# Patient Record
Sex: Female | Born: 1965
Health system: Southern US, Community
[De-identification: ages and names within clinical notes are randomized; demographics above are authoritative.]

## PROBLEM LIST (undated history)

## (undated) DIAGNOSIS — I10 Essential (primary) hypertension: Secondary | ICD-10-CM

## (undated) DIAGNOSIS — F419 Anxiety disorder, unspecified: Secondary | ICD-10-CM

## (undated) HISTORY — PX: WISDOM TOOTH EXTRACTION: SHX21

---

## 1998-02-08 ENCOUNTER — Other Ambulatory Visit: Admission: RE | Admit: 1998-02-08 | Discharge: 1998-02-08 | Payer: Self-pay | Admitting: *Deleted

## 1999-06-12 ENCOUNTER — Other Ambulatory Visit: Admission: RE | Admit: 1999-06-12 | Discharge: 1999-06-12 | Payer: Self-pay | Admitting: *Deleted

## 2000-10-15 ENCOUNTER — Other Ambulatory Visit: Admission: RE | Admit: 2000-10-15 | Discharge: 2000-10-15 | Payer: Self-pay | Admitting: Obstetrics and Gynecology

## 2001-09-19 ENCOUNTER — Inpatient Hospital Stay (HOSPITAL_COMMUNITY): Admission: AD | Admit: 2001-09-19 | Discharge: 2001-09-19 | Payer: Self-pay | Admitting: Obstetrics and Gynecology

## 2001-09-19 ENCOUNTER — Other Ambulatory Visit: Admission: RE | Admit: 2001-09-19 | Discharge: 2001-09-19 | Payer: Self-pay | Admitting: *Deleted

## 2001-09-19 ENCOUNTER — Other Ambulatory Visit: Admission: RE | Admit: 2001-09-19 | Discharge: 2001-09-19 | Payer: Self-pay | Admitting: Obstetrics and Gynecology

## 2002-04-16 ENCOUNTER — Inpatient Hospital Stay (HOSPITAL_COMMUNITY): Admission: AD | Admit: 2002-04-16 | Discharge: 2002-04-16 | Payer: Self-pay | Admitting: Obstetrics and Gynecology

## 2002-04-16 ENCOUNTER — Encounter: Payer: Self-pay | Admitting: Obstetrics and Gynecology

## 2002-04-19 ENCOUNTER — Inpatient Hospital Stay (HOSPITAL_COMMUNITY): Admission: AD | Admit: 2002-04-19 | Discharge: 2002-04-22 | Payer: Self-pay | Admitting: Obstetrics and Gynecology

## 2002-06-09 ENCOUNTER — Other Ambulatory Visit: Admission: RE | Admit: 2002-06-09 | Discharge: 2002-06-09 | Payer: Self-pay | Admitting: Obstetrics and Gynecology

## 2003-08-02 ENCOUNTER — Other Ambulatory Visit: Admission: RE | Admit: 2003-08-02 | Discharge: 2003-08-02 | Payer: Self-pay | Admitting: Obstetrics & Gynecology

## 2005-10-13 ENCOUNTER — Emergency Department (HOSPITAL_COMMUNITY): Admission: EM | Admit: 2005-10-13 | Discharge: 2005-10-13 | Payer: Self-pay | Admitting: Emergency Medicine

## 2005-10-17 ENCOUNTER — Emergency Department (HOSPITAL_COMMUNITY): Admission: EM | Admit: 2005-10-17 | Discharge: 2005-10-17 | Payer: Self-pay | Admitting: Emergency Medicine

## 2005-10-25 ENCOUNTER — Ambulatory Visit: Payer: Self-pay | Admitting: Cardiology

## 2005-10-30 ENCOUNTER — Ambulatory Visit: Payer: Self-pay | Admitting: Cardiology

## 2005-11-06 ENCOUNTER — Other Ambulatory Visit: Admission: RE | Admit: 2005-11-06 | Discharge: 2005-11-06 | Payer: Self-pay | Admitting: *Deleted

## 2005-12-18 ENCOUNTER — Encounter: Admission: RE | Admit: 2005-12-18 | Discharge: 2005-12-18 | Payer: Self-pay | Admitting: *Deleted

## 2007-02-03 ENCOUNTER — Other Ambulatory Visit: Admission: RE | Admit: 2007-02-03 | Discharge: 2007-02-03 | Payer: Self-pay | Admitting: *Deleted

## 2007-02-18 ENCOUNTER — Encounter: Admission: RE | Admit: 2007-02-18 | Discharge: 2007-02-18 | Payer: Self-pay | Admitting: *Deleted

## 2008-04-14 ENCOUNTER — Encounter: Admission: RE | Admit: 2008-04-14 | Discharge: 2008-04-14 | Payer: Self-pay | Admitting: Gynecology

## 2008-05-10 ENCOUNTER — Other Ambulatory Visit: Admission: RE | Admit: 2008-05-10 | Discharge: 2008-05-10 | Payer: Self-pay | Admitting: Gynecology

## 2009-06-12 ENCOUNTER — Inpatient Hospital Stay (HOSPITAL_COMMUNITY): Admission: AD | Admit: 2009-06-12 | Discharge: 2009-06-12 | Payer: Self-pay | Admitting: Obstetrics and Gynecology

## 2009-10-27 ENCOUNTER — Encounter: Admission: RE | Admit: 2009-10-27 | Discharge: 2009-10-27 | Payer: Self-pay | Admitting: Obstetrics and Gynecology

## 2010-07-30 ENCOUNTER — Encounter: Payer: Self-pay | Admitting: *Deleted

## 2010-08-07 ENCOUNTER — Other Ambulatory Visit (HOSPITAL_COMMUNITY)
Admission: RE | Admit: 2010-08-07 | Discharge: 2010-08-07 | Disposition: A | Discharge status: Home / Self Care | Payer: 59 | Source: Ambulatory Visit | Attending: Geriatric Medicine | Admitting: Geriatric Medicine

## 2010-08-07 ENCOUNTER — Other Ambulatory Visit: Payer: Self-pay

## 2010-08-07 DIAGNOSIS — R87810 Cervical high risk human papillomavirus (HPV) DNA test positive: Secondary | ICD-10-CM | POA: Insufficient documentation

## 2010-08-07 DIAGNOSIS — Z01419 Encounter for gynecological examination (general) (routine) without abnormal findings: Secondary | ICD-10-CM | POA: Insufficient documentation

## 2010-10-10 LAB — HCG, QUANTITATIVE, PREGNANCY: hCG, Beta Chain, Quant, S: 233 m[IU]/mL — ABNORMAL HIGH (ref <5)

## 2010-10-11 ENCOUNTER — Other Ambulatory Visit: Payer: Self-pay | Admitting: Obstetrics and Gynecology

## 2010-10-11 DIAGNOSIS — Z1231 Encounter for screening mammogram for malignant neoplasm of breast: Secondary | ICD-10-CM

## 2010-10-30 ENCOUNTER — Ambulatory Visit: Payer: 59

## 2010-11-06 ENCOUNTER — Ambulatory Visit: Payer: 59

## 2010-11-24 NOTE — Discharge Summary (Signed)
NAME:  Amber Mendez, Amber Mendez                       ACCOUNT NO.:  [id number]   MEDICAL RECORD NO.:  [id number]                   PATIENT TYPE:  INP   LOCATION:  9147                                 FACILITY:  [facility]   PHYSICIAN:  Maxie Better, M.D.            DATE OF BIRTH:  01-17-1966   DATE OF ADMISSION:  04/19/2002  DATE OF DISCHARGE:  04/22/2002                                 DISCHARGE SUMMARY   ADMISSION DIAGNOSES:  1. Intrauterine gestation at 71 and 6/7 weeks.  2. Mild preeclampsia.   DISCHARGE DIAGNOSES:  1. Intrauterine gestation at 76 and 6/7 weeks delivered.  2. Mild preeclampsia, resolved.  3. Postpartum anemia.   HISTORY OF PRESENT ILLNESS:  A 45 year old gravida 1, para 0 female at 55  and 6/[redacted] weeks gestation admitted for induction of labor secondary to mild  preeclampsia.  The patient has hand, leg edema.  No headache, visual  changes, or epigastric pain.  Had her 24 urine total protein 4 mg/24-hour  urine.  Uric acid was 5.2.  Her LFTs were normal.  The patient had been on  bed rest due to swelling and blood pressure from September 23.  She had been  getting nonstress tests twice per week.  Estimated fetal weight by  ultrasound was 5 pounds 10 ounces.  Her group B Strep culture was positive.   HOSPITAL COURSE:  On admission the patient's blood pressure was 144/101.  Her examination was notable for 1+ edema.  Deep tendon reflexes at 2-3+.  No  clonus.  Her cervix was closed, soft, -2/-1, vertex presentation.  Tracing  showed baseline fetal heart rate of 140 which reactive, irregular in flash,  and frequent contractions.  Repeat [facility] laboratories were obtained on  admission.  Magnesium sulfate was planned in active labor.  Penicillin  prophylaxis for group B Strep culture.  The patient was placed on Cervidil  for ripening of her cervix with plans for Pitocin augmentation thereafter.  The patient did not require any antihypertensive medications.  After the  Cervidil  her cervix was fingertip, 70%, -2.  Her laboratories had shown  platelet count of 157,000, hematocrit 33.6, normal LFTs, uric acid of 5.2.  Pitocin was started.  The patient progressed in labor and subsequently  became completely dilated, pushed well resulting in a vaginal delivery of a  live female from the right occiput anterior presentation over a first degree  perineal laceration.  Apgars of 9 and 9.  Weight of the baby was 5 pounds 14  ounces, 20 inches long, Apgars as stated before.  She had no cervical  lacerations.  She had a right periurethral laceration that was not bleeding.  The perineal laceration was repaired with 3-0 chromic suture.  The magnesium  sulfate was continued for 24 hours after which the patient began diuresing  very well.  Her blood pressure was about 127/74 postpartum and by postpartum  day number three the patient  was doing well.  Her uterus was two  fingerbreadths below the umbilicus, nontender.  She was deemed well to be  discharged home.  A CBC on postpartum day number one showed a hemoglobin of  10.3, hematocrit of 29.3, white count 13.6, platelet count 141,000.   DISPOSITION:  Home.   DISCHARGE INSTRUCTIONS:  Call for temperature greater or equal to 100.4.  Nothing per vagina for four to six weeks.  Call of soaking a regular pad  every hour or more frequently.  Preeclampsia warning signs were again  reviewed.  Follow-up in six weeks at the [facility].    DISCHARGE MEDICATIONS:  1. Prenatal vitamins one p.o. q.d.  2. Over-the-counter iron supplementation one p.o. q.d.  3. Nonsteroidals for cramps.                                               Maxie Better, M.D.    [place]/MEDQ  D:  05/24/2002  T:  05/25/2002  Job:  [id number]

## 2011-01-29 ENCOUNTER — Ambulatory Visit
Admission: RE | Admit: 2011-01-29 | Discharge: 2011-01-29 | Disposition: A | Discharge status: Home / Self Care | Payer: 59 | Source: Ambulatory Visit | Attending: Obstetrics and Gynecology | Admitting: Obstetrics and Gynecology

## 2011-01-29 DIAGNOSIS — Z1231 Encounter for screening mammogram for malignant neoplasm of breast: Secondary | ICD-10-CM

## 2011-03-22 ENCOUNTER — Inpatient Hospital Stay (HOSPITAL_COMMUNITY): Admit: 2011-03-22 | Payer: Self-pay

## 2011-03-22 ENCOUNTER — Other Ambulatory Visit: Payer: Self-pay | Admitting: Nurse Practitioner

## 2011-03-22 ENCOUNTER — Other Ambulatory Visit (HOSPITAL_COMMUNITY)
Admission: RE | Admit: 2011-03-22 | Discharge: 2011-03-22 | Disposition: A | Discharge status: Home / Self Care | Payer: 59 | Source: Ambulatory Visit | Attending: Obstetrics and Gynecology | Admitting: Obstetrics and Gynecology

## 2011-03-22 DIAGNOSIS — Z01419 Encounter for gynecological examination (general) (routine) without abnormal findings: Secondary | ICD-10-CM | POA: Insufficient documentation

## 2011-03-22 DIAGNOSIS — Z113 Encounter for screening for infections with a predominantly sexual mode of transmission: Secondary | ICD-10-CM | POA: Insufficient documentation

## 2011-03-22 DIAGNOSIS — Z1159 Encounter for screening for other viral diseases: Secondary | ICD-10-CM | POA: Insufficient documentation

## 2011-04-24 ENCOUNTER — Other Ambulatory Visit: Payer: Self-pay | Admitting: Obstetrics and Gynecology

## 2011-04-27 NOTE — Patient Instructions (Addendum)
   Your procedure is scheduled on: Friday October 26th  Enter through the Main Entrance of [facility] at:2:25pm Pick up the phone at the desk and dial [phone] and inform us of your arrival.  Please call this number if you have any problems the morning of surgery: [phone]  Remember: Do not eat food after midnight:Thursday Do not drink clear liquids after:toast by 7am, then 11:30am Take these medicines the morning of surgery with a SIP OF WATER: labetolol, zoloft, xanax if needed  Do not wear jewelry, make-up, or FINGER nail polish Do not wear lotions, powders, or perfumes.  You may not wear deodorant. Do not shave 48 hours prior to surgery. Do not bring valuables to the hospital.   Patients discharged on the day of surgery will not be allowed to drive home.     Remember to use your hibiclens as instructed.Please shower with 1/2 bottle the evening before your surgery and the other 1/2 bottle the morning of surgery.

## 2011-05-01 ENCOUNTER — Encounter (HOSPITAL_COMMUNITY)
Admission: RE | Admit: 2011-05-01 | Discharge: 2011-05-01 | Disposition: A | Discharge status: Home / Self Care | Payer: 59 | Source: Ambulatory Visit | Attending: Obstetrics and Gynecology | Admitting: Obstetrics and Gynecology

## 2011-05-01 ENCOUNTER — Encounter (HOSPITAL_COMMUNITY): Payer: Self-pay

## 2011-05-01 HISTORY — DX: Essential (primary) hypertension: I10

## 2011-05-01 HISTORY — DX: Anxiety disorder, unspecified: F41.9

## 2011-05-01 LAB — CBC
MCV: 88.6 fL (ref 78.0–100.0)
Platelets: 190 10*3/uL (ref 150–400)
RBC: 4.21 MIL/uL (ref 3.87–5.11)
RDW: 14.2 % (ref 11.5–15.5)
WBC: 4.2 10*3/uL (ref 4.0–10.5)

## 2011-05-04 ENCOUNTER — Encounter (HOSPITAL_COMMUNITY)
Admission: RE | Disposition: A | Discharge status: Home / Self Care | Payer: Self-pay | Source: Ambulatory Visit | Attending: Obstetrics and Gynecology

## 2011-05-04 ENCOUNTER — Encounter (HOSPITAL_COMMUNITY): Payer: Self-pay | Admitting: Anesthesiology

## 2011-05-04 ENCOUNTER — Ambulatory Visit (HOSPITAL_COMMUNITY): Payer: 59 | Admitting: Anesthesiology

## 2011-05-04 ENCOUNTER — Ambulatory Visit (HOSPITAL_COMMUNITY)
Admission: RE | Admit: 2011-05-04 | Discharge: 2011-05-04 | Disposition: A | Discharge status: Home / Self Care | Payer: 59 | Source: Ambulatory Visit | Attending: Obstetrics and Gynecology | Admitting: Obstetrics and Gynecology

## 2011-05-04 DIAGNOSIS — Z01812 Encounter for preprocedural laboratory examination: Secondary | ICD-10-CM | POA: Insufficient documentation

## 2011-05-04 DIAGNOSIS — Z01818 Encounter for other preprocedural examination: Secondary | ICD-10-CM | POA: Insufficient documentation

## 2011-05-04 DIAGNOSIS — Z302 Encounter for sterilization: Secondary | ICD-10-CM

## 2011-05-04 HISTORY — PX: LAPAROSCOPIC TUBAL LIGATION: SHX1937

## 2011-05-04 LAB — PREGNANCY, URINE: Preg Test, Ur: NEGATIVE

## 2011-05-04 SURGERY — LIGATION, FALLOPIAN TUBE, LAPAROSCOPIC
Anesthesia: General | Laterality: Bilateral

## 2011-05-04 MED ORDER — KETOROLAC TROMETHAMINE 30 MG/ML IJ SOLN: 15.0000 mg | Freq: Once | INTRAMUSCULAR | Status: DC | PRN

## 2011-05-04 MED ORDER — ONDANSETRON HCL 4 MG/2ML IJ SOLN
INTRAMUSCULAR | Status: DC | PRN
  Administered 2011-05-04: 4 mg via INTRAVENOUS

## 2011-05-04 MED ORDER — PROPOFOL 10 MG/ML IV EMUL
INTRAVENOUS | Status: AC
  Filled 2011-05-04: qty 20

## 2011-05-04 MED ORDER — HYDROCODONE-ACETAMINOPHEN 5-500 MG PO TABS: 1.0000 | ORAL_TABLET | Freq: Four times a day (QID) | ORAL | Status: AC | PRN

## 2011-05-04 MED ORDER — LABETALOL HCL 5 MG/ML IV SOLN
10.0000 mg | Freq: Once | INTRAVENOUS | Status: AC
  Administered 2011-05-04: 10 mg via INTRAVENOUS

## 2011-05-04 MED ORDER — ACETAMINOPHEN 325 MG PO TABS: 325.0000 mg | ORAL_TABLET | ORAL | Status: DC | PRN

## 2011-05-04 MED ORDER — GLYCOPYRROLATE 0.2 MG/ML IJ SOLN
INTRAMUSCULAR | Status: DC | PRN
  Administered 2011-05-04 (×2): .4 mg via INTRAVENOUS

## 2011-05-04 MED ORDER — LABETALOL HCL 5 MG/ML IV SOLN: 5.0000 mg | INTRAVENOUS | Status: DC | PRN

## 2011-05-04 MED ORDER — BUPIVACAINE HCL (PF) 0.25 % IJ SOLN
INTRAMUSCULAR | Status: DC | PRN
  Administered 2011-05-04: 10 mL

## 2011-05-04 MED ORDER — LIDOCAINE HCL (CARDIAC) 20 MG/ML IV SOLN
INTRAVENOUS | Status: DC | PRN
  Administered 2011-05-04: 40 mg via INTRAVENOUS
  Administered 2011-05-04 (×2): 30 mg via INTRAVENOUS

## 2011-05-04 MED ORDER — MIDAZOLAM HCL 5 MG/5ML IJ SOLN
INTRAMUSCULAR | Status: DC | PRN
  Administered 2011-05-04: 2 mg via INTRAVENOUS

## 2011-05-04 MED ORDER — ROCURONIUM BROMIDE 100 MG/10ML IV SOLN
INTRAVENOUS | Status: DC | PRN
  Administered 2011-05-04: 20 mg via INTRAVENOUS

## 2011-05-04 MED ORDER — GLYCOPYRROLATE 0.2 MG/ML IJ SOLN
INTRAMUSCULAR | Status: AC
  Filled 2011-05-04: qty 1

## 2011-05-04 MED ORDER — NEOSTIGMINE METHYLSULFATE 1 MG/ML IJ SOLN
INTRAMUSCULAR | Status: DC | PRN
  Administered 2011-05-04: 4 mg via INTRAVENOUS

## 2011-05-04 MED ORDER — FENTANYL CITRATE 0.05 MG/ML IJ SOLN
INTRAMUSCULAR | Status: AC
  Filled 2011-05-04: qty 5

## 2011-05-04 MED ORDER — LIDOCAINE HCL (CARDIAC) 20 MG/ML IV SOLN
INTRAVENOUS | Status: AC
  Filled 2011-05-04: qty 5

## 2011-05-04 MED ORDER — MIDAZOLAM HCL 2 MG/2ML IJ SOLN
INTRAMUSCULAR | Status: AC
  Filled 2011-05-04: qty 2

## 2011-05-04 MED ORDER — NEOSTIGMINE METHYLSULFATE 1 MG/ML IJ SOLN
INTRAMUSCULAR | Status: AC
  Filled 2011-05-04: qty 10

## 2011-05-04 MED ORDER — LABETALOL HCL 5 MG/ML IV SOLN
INTRAVENOUS | Status: AC
  Administered 2011-05-04: 10 mg via INTRAVENOUS
  Filled 2011-05-04: qty 4

## 2011-05-04 MED ORDER — PROMETHAZINE HCL 25 MG/ML IJ SOLN: 6.2500 mg | INTRAMUSCULAR | Status: DC | PRN

## 2011-05-04 MED ORDER — ROCURONIUM BROMIDE 50 MG/5ML IV SOLN
INTRAVENOUS | Status: AC
  Filled 2011-05-04: qty 1

## 2011-05-04 MED ORDER — KETOROLAC TROMETHAMINE 30 MG/ML IJ SOLN
INTRAMUSCULAR | Status: AC
  Filled 2011-05-04: qty 1

## 2011-05-04 MED ORDER — FENTANYL CITRATE 0.05 MG/ML IJ SOLN: 25.0000 ug | INTRAMUSCULAR | Status: DC | PRN

## 2011-05-04 MED ORDER — IBUPROFEN 200 MG PO TABS: 800.0000 mg | ORAL_TABLET | Freq: Four times a day (QID) | ORAL | Status: AC | PRN

## 2011-05-04 MED ORDER — PROPOFOL 10 MG/ML IV EMUL
INTRAVENOUS | Status: DC | PRN
  Administered 2011-05-04: 180 mg via INTRAVENOUS

## 2011-05-04 MED ORDER — LACTATED RINGERS IV SOLN
INTRAVENOUS | Status: DC
Start: 2011-05-04 — End: 2011-05-04
  Administered 2011-05-04 (×2): via INTRAVENOUS

## 2011-05-04 MED ORDER — KETOROLAC TROMETHAMINE 30 MG/ML IJ SOLN
INTRAMUSCULAR | Status: DC | PRN
  Administered 2011-05-04: 30 mg via INTRAVENOUS

## 2011-05-04 MED ORDER — ONDANSETRON HCL 4 MG/2ML IJ SOLN
INTRAMUSCULAR | Status: AC
  Filled 2011-05-04: qty 2

## 2011-05-04 MED ORDER — FENTANYL CITRATE 0.05 MG/ML IJ SOLN
INTRAMUSCULAR | Status: DC | PRN
  Administered 2011-05-04 (×3): 50 ug via INTRAVENOUS

## 2011-05-04 SURGICAL SUPPLY — 19 items
CATH ROBINSON RED A/P 16FR (CATHETERS) ×2 IMPLANT
CLOTH BEACON ORANGE TIMEOUT ST (SAFETY) ×2 IMPLANT
DRSG COVADERM PLUS 2X2 (GAUZE/BANDAGES/DRESSINGS) ×2 IMPLANT
ELECT REM PT RETURN 9FT ADLT (ELECTROSURGICAL) ×2
ELECTRODE REM PT RTRN 9FT ADLT (ELECTROSURGICAL) ×1 IMPLANT
GLOVE BIO SURGEON STRL SZ 6.5 (GLOVE) ×2 IMPLANT
GLOVE BIOGEL PI IND STRL 7.0 (GLOVE) ×2 IMPLANT
GLOVE BIOGEL PI INDICATOR 7.0 (GLOVE) ×2
GOWN PREVENTION PLUS LG XLONG (DISPOSABLE) ×4 IMPLANT
NEEDLE INSUFFLATION 14GA 120MM (NEEDLE) ×2 IMPLANT
PACK LAPAROSCOPY BASIN (CUSTOM PROCEDURE TRAY) ×2 IMPLANT
PENCIL BUTTON HOLSTER BLD 10FT (ELECTRODE) ×2 IMPLANT
SPONGE GAUZE 2X2 8PLY STRL LF (GAUZE/BANDAGES/DRESSINGS) ×2 IMPLANT
SUT VICRYL 0 UR6 27IN ABS (SUTURE) ×2 IMPLANT
SUT VICRYL 4-0 PS2 18IN ABS (SUTURE) ×2 IMPLANT
TOWEL OR 17X24 6PK STRL BLUE (TOWEL DISPOSABLE) ×4 IMPLANT
TROCAR Z-THREAD BLADED 11X100M (TROCAR) ×2 IMPLANT
TROCAR Z-THREAD BLADED 5X100MM (TROCAR) ×2 IMPLANT
WATER STERILE IRR 1000ML POUR (IV SOLUTION) ×2 IMPLANT

## 2011-05-04 NOTE — Anesthesia Procedure Notes (Addendum)
Procedure Name: Intubation Date/Time: 05/04/2011 3:03 PM Performed by: Suella Grove Pre-anesthesia Checklist: Patient identified, Timeout performed, Emergency Drugs available, Suction available and Patient being monitored Patient Re-evaluated:Patient Re-evaluated prior to inductionOxygen Delivery Method: Circle System Utilized Intubation Type: IV induction Ventilation: Mask ventilation without difficulty Laryngoscope Size: Mac and 3 Grade View: Grade II Tube type: Oral Tube size: 7.0 mm Number of attempts: 1 Placement Confirmation: ETT inserted through vocal cords under direct vision,  breath sounds checked- equal and bilateral and positive ETCO2 Secured at: 21 cm Tube secured with: Tape Dental Injury: Teeth and Oropharynx as per pre-operative assessment

## 2011-05-04 NOTE — Brief Op Note (Signed)
05/04/2011  4:06 PM  PATIENT:  Amber Mendez  45 y.o. female  PRE-OPERATIVE DIAGNOSIS:  Desires Sterilization  POST-OPERATIVE DIAGNOSIS:  Desires Sterilization  PROCEDURE:  Procedure(s): LAPAROSCOPIC TUBAL LIGATION WITH BIPOLAR CAUTERY  SURGEON:  Surgeon(s): Jouri Threat Cathie Beams, MD  PHYSICIAN ASSISTANT:   ASSISTANTS: NONE  ANESTHESIA:   general  EBL:  Total I/O In: 1600 [I.V.:1600] Out: 110 [Urine:100; Blood:10]  BLOOD ADMINISTERED:none  DRAINS: none   LOCAL MEDICATIONS USED:  MARCAINE 7CC  SPECIMEN:  No Specimen  DISPOSITION OF SPECIMEN:  N/A  COUNTS:  YES  TOURNIQUET:  * No tourniquets in log *  DICTATION: .Other Dictation: Dictation Number   PLAN OF CARE: Discharge to home after PACU  PATIENT DISPOSITION:  PACU - hemodynamically stable.   Delay start of Pharmacological VTE agent (>24hrs) due to surgical blood loss or risk of bleeding:  no

## 2011-05-04 NOTE — Anesthesia Preprocedure Evaluation (Addendum)
Anesthesia Evaluation  Patient identified by MRN, date of birth, ID band Patient awake  General Assessment Comment  Reviewed: Allergy & Precautions, H&P , Patient's Chart, lab work & pertinent test results, reviewed documented beta blocker date and time   History of Anesthesia Complications Negative for: history of anesthetic complications  Airway Mallampati: II TM Distance: >3 FB Neck ROM: full    Dental No notable dental hx.    Pulmonary  clear to auscultation  Pulmonary exam normal       Cardiovascular Exercise Tolerance: Good hypertension, On Medications and On Home Beta Blockers regular Normal    Neuro/Psych Negative Neurological ROS  Negative Psych ROS   GI/Hepatic negative GI ROS Neg liver ROS    Endo/Other  Negative Endocrine ROS  Renal/GU negative Renal ROS     Musculoskeletal   Abdominal   Peds  Hematology negative hematology ROS (+)   Anesthesia Other Findings Patient did not take b blocker (regularly noncompliant) so we gave 10mg  IV labetolol... Will give more prn to control bp  Reproductive/Obstetrics negative OB ROS                          Anesthesia Physical Anesthesia Plan  ASA: III  Anesthesia Plan: General   Post-op Pain Management:    Induction:   Airway Management Planned:   Additional Equipment:   Intra-op Plan:   Post-operative Plan:   Informed Consent: I have reviewed the patients History and Physical, chart, labs and discussed the procedure including the risks, benefits and alternatives for the proposed anesthesia with the patient or authorized representative who has indicated his/her understanding and acceptance.   Dental Advisory Given  Plan Discussed with: CRNA and Surgeon  Anesthesia Plan Comments:        Anesthesia Quick Evaluation

## 2011-05-04 NOTE — Transfer of Care (Signed)
Immediate Anesthesia Transfer of Care Note  Patient: Amber Mendez  Procedure(s) Performed:  LAPAROSCOPIC TUBAL LIGATION - Bilateral Laparoscopic Tubaligation w/Cautery.  Patient Location: PACU  Anesthesia Type: General  Level of Consciousness: awake  Airway & Oxygen Therapy: Patient Spontanous Breathing and Patient connected to nasal cannula oxygen  Post-op Assessment: Report given to PACU RN, Post -op Vital signs reviewed and stable and Patient moving all extremities  Post vital signs: Reviewed and stable  Complications: No apparent anesthesia complications

## 2011-05-04 NOTE — Anesthesia Postprocedure Evaluation (Signed)
Anesthesia Post Note  Patient: Amber Mendez  Procedure(s) Performed:  LAPAROSCOPIC TUBAL LIGATION - Bilateral Laparoscopic Tubaligation w/Cautery.  Anesthesia type: General  Patient location: PACU  Post pain: Pain level controlled  Post assessment: Post-op Vital signs reviewed  Last Vitals:  Filed Vitals:   05/04/11 1700  BP: 131/76  Pulse: 73  Temp: 36.6 C  Resp: 20    Post vital signs: Reviewed  Level of consciousness: sedated  Complications: No apparent anesthesia complicationsfj

## 2011-05-05 NOTE — Op Note (Signed)
Amber Mendez, Amber Mendez             ACCOUNT NO.:  [id number]  MEDICAL RECORD NO.:  [id number]  LOCATION:  [facility]                          FACILITY:  [facility]  PHYSICIAN:  Maxie Better, M.D.DATE OF BIRTH:  01-14-1966  DATE OF PROCEDURE:  05/04/2011 DATE OF DISCHARGE:  05/04/2011                              OPERATIVE REPORT   PREOPERATIVE DIAGNOSIS:  Desires sterilization.  PROCEDURE:  Laparoscopic tubal ligation with bipolar cautery.  POSTOPERATIVE DIAGNOSIS:  Desires sterilization.  ANESTHESIA:  General.  SURGEON:  Maxie Better, MD.  ASSISTANT:  None.  PROCEDURE IN DETAIL:  Under adequate general anesthesia, the patient was placed in the dorsal lithotomy position.  She was sterilely prepped and draped in usual fashion.  The bladder was catheterized for large amount of urine.  Examination under anesthesia revealed anteverted uterus.  No adnexal masses could be appreciated.  A bivalve speculum was placed in the vagina.  Single-tooth tenaculum was placed on the anterior lip of the cervix and acorn cannula was introduced into the cervical os and attached to the tenaculum for manipulation of the uterus.  The bivalve speculum was then removed.  Attention was turned to the abdomen.  A 0.25% Marcaine was injected infraumbilically and a small vertical infraumbilical incision was made.  Veress needle was introduced. Opening pressure of 2 was noted.  2.5 L of CO2 was insufflated.  The Veress needle was removed.  A 10-mm disposable trocar with sleeve was introduced into the abdomen without incident.  A lighted video laparoscope was placed through that port.  The second incision site was placed suprapubically after 0.25% Marcaine injection and a 5-mm port placed.  The panoramic inspection noted normal liver edge.  The appendix was elongated and extended over the right fallopian tube.  The uterus was axial.  Left tube and ovary was also normal.  No endometriosis noted, anterior or  posterior cul de sac.  Using the Kleppinger, the midportion of the right fallopian tube was cauterized for at least a centimeter.  Similar procedure was performed on the left side.  The left side was a little more difficult due to the axial position of the uterus requiring the acorn cannula to be readjusted and for countertraction to be placed on the tube in order to facilitate the procedure.  Nonetheless, it was felt to be adequate.  At that point, the instruments from the suprapubic site was removed under direct visualization.  The infraumbilical site was subsequently removed.  The abdomen was deflated.  The suprapubic site had a figure-of-eight suture placed due to ongoing bleeding and despite cauterization and the infraumbilical site had a 4-0 Vicryl subcuticular stitch with Dermabond placed on both sides.  The instruments in the vagina was removed.  SPECIMEN:  None.  ESTIMATED BLOOD LOSS:  Minimal.  COMPLICATION:  None.  The patient tolerated the procedure well and was transferred to recovery in stable condition.     Maxie Better, M.D.     [place]/MEDQ  D:  05/04/2011  T:  05/05/2011  Job:  [id number]

## 2011-05-07 ENCOUNTER — Encounter (HOSPITAL_COMMUNITY): Payer: Self-pay | Admitting: Obstetrics and Gynecology

## 2011-10-06 ENCOUNTER — Other Ambulatory Visit: Payer: Self-pay

## 2011-10-06 ENCOUNTER — Emergency Department (HOSPITAL_COMMUNITY)
Admission: EM | Admit: 2011-10-06 | Discharge: 2011-10-06 | Discharge status: Against Medical Advice | Payer: 59 | Attending: Emergency Medicine | Admitting: Emergency Medicine

## 2011-10-06 ENCOUNTER — Encounter (HOSPITAL_COMMUNITY): Payer: Self-pay | Admitting: Adult Health

## 2011-10-06 DIAGNOSIS — I10 Essential (primary) hypertension: Secondary | ICD-10-CM | POA: Insufficient documentation

## 2011-10-06 DIAGNOSIS — M79609 Pain in unspecified limb: Secondary | ICD-10-CM | POA: Insufficient documentation

## 2011-10-06 DIAGNOSIS — R110 Nausea: Secondary | ICD-10-CM | POA: Insufficient documentation

## 2011-10-06 NOTE — ED Notes (Addendum)
C/o left arm pain that is cold and high blood pressure that has been ongoing for 3 months. Seen at primary care doctor for symptoms, pain is not getting better. Pain does not radiate, c/o sweating at night denies SOB c/o intermittent nausea.  implaon implant left side

## 2011-10-06 NOTE — ED Notes (Signed)
[id number]<GN> Expected date:<BR> Expected time:<BR> Means of arrival:<BR> Comments:<BR> EKG

## 2012-04-25 ENCOUNTER — Other Ambulatory Visit: Payer: Self-pay | Admitting: Obstetrics and Gynecology

## 2012-04-25 DIAGNOSIS — N926 Irregular menstruation, unspecified: Secondary | ICD-10-CM

## 2012-04-30 ENCOUNTER — Other Ambulatory Visit: Payer: 59

## 2012-05-01 ENCOUNTER — Ambulatory Visit
Admission: RE | Admit: 2012-05-01 | Discharge: 2012-05-01 | Disposition: A | Discharge status: Home / Self Care | Payer: 59 | Source: Ambulatory Visit | Attending: Obstetrics and Gynecology | Admitting: Obstetrics and Gynecology

## 2012-05-01 DIAGNOSIS — N926 Irregular menstruation, unspecified: Secondary | ICD-10-CM

## 2012-06-19 ENCOUNTER — Other Ambulatory Visit: Payer: Self-pay | Admitting: Obstetrics and Gynecology

## 2012-06-19 DIAGNOSIS — Z1231 Encounter for screening mammogram for malignant neoplasm of breast: Secondary | ICD-10-CM

## 2012-07-10 ENCOUNTER — Ambulatory Visit
Admission: RE | Admit: 2012-07-10 | Discharge: 2012-07-10 | Disposition: A | Discharge status: Home / Self Care | Payer: BC Managed Care – PPO | Source: Ambulatory Visit | Attending: Obstetrics and Gynecology | Admitting: Obstetrics and Gynecology

## 2012-07-10 DIAGNOSIS — Z1231 Encounter for screening mammogram for malignant neoplasm of breast: Secondary | ICD-10-CM

## 2013-06-30 ENCOUNTER — Encounter (HOSPITAL_COMMUNITY): Admission: RE | Payer: Self-pay | Source: Ambulatory Visit

## 2013-06-30 SURGERY — ROBOTIC SINGLE SITE TOTAL HYSTERECTOMY
Anesthesia: General

## 2013-07-03 ENCOUNTER — Ambulatory Visit (HOSPITAL_COMMUNITY)
Admission: RE | Admit: 2013-07-03 | Payer: BC Managed Care – PPO | Source: Ambulatory Visit | Admitting: Obstetrics and Gynecology

## 2013-08-06 ENCOUNTER — Other Ambulatory Visit: Payer: Self-pay

## 2013-08-06 DIAGNOSIS — Z1231 Encounter for screening mammogram for malignant neoplasm of breast: Secondary | ICD-10-CM

## 2013-08-27 ENCOUNTER — Ambulatory Visit: Payer: BC Managed Care – PPO

## 2013-11-02 ENCOUNTER — Ambulatory Visit
Admission: RE | Admit: 2013-11-02 | Discharge: 2013-11-02 | Disposition: A | Discharge status: Home / Self Care | Payer: BC Managed Care – PPO | Source: Ambulatory Visit

## 2013-11-02 DIAGNOSIS — Z1231 Encounter for screening mammogram for malignant neoplasm of breast: Secondary | ICD-10-CM

## 2013-11-04 ENCOUNTER — Other Ambulatory Visit: Payer: Self-pay | Admitting: Obstetrics and Gynecology

## 2013-11-04 DIAGNOSIS — R928 Other abnormal and inconclusive findings on diagnostic imaging of breast: Secondary | ICD-10-CM

## 2013-11-05 ENCOUNTER — Ambulatory Visit
Admission: RE | Admit: 2013-11-05 | Discharge: 2013-11-05 | Disposition: A | Discharge status: Home / Self Care | Payer: BC Managed Care – PPO | Source: Ambulatory Visit | Attending: Obstetrics and Gynecology | Admitting: Obstetrics and Gynecology

## 2013-11-05 DIAGNOSIS — R928 Other abnormal and inconclusive findings on diagnostic imaging of breast: Secondary | ICD-10-CM

## 2013-11-11 ENCOUNTER — Ambulatory Visit: Payer: Self-pay | Admitting: Podiatry

## 2013-11-16 ENCOUNTER — Other Ambulatory Visit: Payer: BC Managed Care – PPO

## 2013-11-18 ENCOUNTER — Ambulatory Visit (INDEPENDENT_AMBULATORY_CARE_PROVIDER_SITE_OTHER): Payer: BC Managed Care – PPO

## 2013-11-18 ENCOUNTER — Encounter: Payer: Self-pay | Admitting: Podiatry

## 2013-11-18 ENCOUNTER — Ambulatory Visit (INDEPENDENT_AMBULATORY_CARE_PROVIDER_SITE_OTHER): Payer: BC Managed Care – PPO | Admitting: Podiatry

## 2013-11-18 VITALS — Ht 65.0 in | Wt 180.0 lb

## 2013-11-18 DIAGNOSIS — M722 Plantar fascial fibromatosis: Secondary | ICD-10-CM

## 2013-11-18 MED ORDER — DICLOFENAC SODIUM 75 MG PO TBEC: 75.0000 mg | DELAYED_RELEASE_TABLET | Freq: Two times a day (BID) | ORAL | Status: DC

## 2013-11-18 MED ORDER — TRIAMCINOLONE ACETONIDE 10 MG/ML IJ SUSP
10.0000 mg | Freq: Once | INTRAMUSCULAR | Status: AC
  Administered 2013-11-18: 10 mg

## 2013-11-18 NOTE — Progress Notes (Signed)
Subjective:     Patient ID: Amber Mendez, female   DOB: January 03, 1966, 48 y.o.   MRN: [id number]  Foot Pain   patient presents with six-month history of heel pain right that she has tried different conservative options for without relief of symptoms   Review of Systems  All other systems reviewed and are negative.      Objective:   Physical Exam  Nursing note and vitals reviewed. Constitutional: She is oriented to person, place, and time.  Musculoskeletal: Normal range of motion.  Neurological: She is oriented to person, place, and time.  Skin: Skin is warm.   neurovascular status intact with muscle strength adequate and range of motion of the subtalar and midtarsal joint within normal limits. Patient is found to have inflammation and pain around the right plantar heel at the insertion of the tendon the calcaneus with fluid buildup. Patient's digits are well perfused and there is depression of the arch upon weight bearing     Assessment:     Plantar fasciitis of an acute nature right heel with mechanical dysfunction    Plan:     H&P and x-ray reviewed and today I injected the right plantar fascia 3 mg Kenalog 5 mg Xylocaine Marcaine mixture and applied fascially brace with instructions on usage. Discussed long-term orthotics which we will review at next visit

## 2013-11-18 NOTE — Patient Instructions (Signed)

## 2013-11-18 NOTE — Progress Notes (Signed)
   Subjective:    Patient ID: Amber Mendez, female    DOB: [birth year], 48 y.o.   MRN: [id number]  HPI Comments: "I have heel pain"  Patient aching, sharp plantar heel right for about 6 months. She has AM pain. Certain shoes aggravate. Worse with standing and walking. She has tried ice, ibuprofen, brace for arch support-no help.  Foot Pain      Review of Systems  Musculoskeletal: Positive for gait problem.  All other systems reviewed and are negative.      Objective:   Physical Exam        Assessment & Plan:

## 2013-11-25 ENCOUNTER — Ambulatory Visit (INDEPENDENT_AMBULATORY_CARE_PROVIDER_SITE_OTHER): Payer: BC Managed Care – PPO | Admitting: Podiatry

## 2013-11-25 ENCOUNTER — Encounter: Payer: Self-pay | Admitting: Podiatry

## 2013-11-25 VITALS — BP 109/70 | HR 101 | Resp 16

## 2013-11-25 DIAGNOSIS — M722 Plantar fascial fibromatosis: Secondary | ICD-10-CM

## 2013-11-25 MED ORDER — TRIAMCINOLONE ACETONIDE 10 MG/ML IJ SUSP
10.0000 mg | Freq: Once | INTRAMUSCULAR | Status: AC
  Administered 2013-11-25: 10 mg

## 2013-11-25 NOTE — Progress Notes (Signed)
Subjective:     Patient ID: Amber Mendez, female   DOB: 07/23/1965, 48 y.o.   MRN: [id number]  HPI patient states that my pain is improved but I'm still having a lot of discomfort in the plantar heel right when I try to ambulate or get up in the morning   Review of Systems     Objective:   Physical Exam Neurovascular status intact with discomfort upon palpation to the plantar fascia at its insertion into the calcaneus right with depression of the arch noted upon weightbearing    Assessment:     Plantar fasciitis right with the position of the arch being part of the problem as to the long nature of her condition    Plan:     Reinjected the plantar fascia 3 mg Kenalog 5 mg Xylocaine Marcaine mixture and scanned for customized orthotics today. I then dispensed night splint with all instructions on usage and ice packs to try to stretch the plantar fascia

## 2013-12-11 ENCOUNTER — Ambulatory Visit (INDEPENDENT_AMBULATORY_CARE_PROVIDER_SITE_OTHER): Payer: BC Managed Care – PPO | Admitting: *Deleted

## 2013-12-11 DIAGNOSIS — M722 Plantar fascial fibromatosis: Secondary | ICD-10-CM

## 2013-12-11 NOTE — Progress Notes (Signed)
   Subjective:    Patient ID: Amber Mendez, female    DOB: Jan 07, 1966, 48 y.o.   MRN: [id number]  HPI  PICK UP ORTHOTICS AND GIVEN INSTRUCTION.    Review of Systems     Objective:   Physical Exam        Assessment & Plan:

## 2013-12-11 NOTE — Patient Instructions (Signed)

## 2013-12-18 ENCOUNTER — Other Ambulatory Visit: Payer: BC Managed Care – PPO

## 2014-01-27 ENCOUNTER — Ambulatory Visit: Payer: BC Managed Care – PPO | Admitting: Podiatry

## 2014-04-14 ENCOUNTER — Other Ambulatory Visit: Payer: Self-pay | Admitting: Obstetrics and Gynecology

## 2014-04-14 DIAGNOSIS — N63 Unspecified lump in unspecified breast: Secondary | ICD-10-CM

## 2014-05-20 ENCOUNTER — Ambulatory Visit
Admission: RE | Admit: 2014-05-20 | Discharge: 2014-05-20 | Disposition: A | Discharge status: Home / Self Care | Payer: BC Managed Care – PPO | Source: Ambulatory Visit | Attending: Obstetrics and Gynecology | Admitting: Obstetrics and Gynecology

## 2014-05-20 DIAGNOSIS — N63 Unspecified lump in unspecified breast: Secondary | ICD-10-CM

## 2014-09-14 ENCOUNTER — Other Ambulatory Visit: Payer: Self-pay | Admitting: Obstetrics and Gynecology

## 2014-09-23 ENCOUNTER — Other Ambulatory Visit (HOSPITAL_COMMUNITY)
Admission: RE | Admit: 2014-09-23 | Discharge: 2014-09-23 | Disposition: A | Discharge status: Home / Self Care | Payer: BLUE CROSS/BLUE SHIELD | Source: Ambulatory Visit | Attending: Nurse Practitioner | Admitting: Nurse Practitioner

## 2014-09-23 ENCOUNTER — Other Ambulatory Visit: Payer: Self-pay | Admitting: Nurse Practitioner

## 2014-09-23 DIAGNOSIS — R87810 Cervical high risk human papillomavirus (HPV) DNA test positive: Secondary | ICD-10-CM | POA: Diagnosis present

## 2014-09-23 DIAGNOSIS — Z113 Encounter for screening for infections with a predominantly sexual mode of transmission: Secondary | ICD-10-CM | POA: Insufficient documentation

## 2014-09-23 DIAGNOSIS — Z01411 Encounter for gynecological examination (general) (routine) with abnormal findings: Secondary | ICD-10-CM | POA: Insufficient documentation

## 2014-09-23 DIAGNOSIS — Z1151 Encounter for screening for human papillomavirus (HPV): Secondary | ICD-10-CM | POA: Diagnosis present

## 2014-09-27 LAB — CYTOLOGY - PAP

## 2014-11-04 ENCOUNTER — Other Ambulatory Visit: Payer: Self-pay | Admitting: Obstetrics and Gynecology

## 2014-11-04 DIAGNOSIS — N63 Unspecified lump in unspecified breast: Secondary | ICD-10-CM

## 2014-11-15 ENCOUNTER — Ambulatory Visit
Admission: RE | Admit: 2014-11-15 | Discharge: 2014-11-15 | Disposition: A | Discharge status: Home / Self Care | Payer: BLUE CROSS/BLUE SHIELD | Source: Ambulatory Visit | Attending: Obstetrics and Gynecology | Admitting: Obstetrics and Gynecology

## 2014-11-15 DIAGNOSIS — N63 Unspecified lump in unspecified breast: Secondary | ICD-10-CM

## 2015-10-20 ENCOUNTER — Other Ambulatory Visit (HOSPITAL_COMMUNITY)
Admission: RE | Admit: 2015-10-20 | Discharge: 2015-10-20 | Disposition: A | Discharge status: Home / Self Care | Payer: BLUE CROSS/BLUE SHIELD | Source: Ambulatory Visit | Attending: Nurse Practitioner | Admitting: Nurse Practitioner

## 2015-10-20 ENCOUNTER — Other Ambulatory Visit: Payer: Self-pay | Admitting: Nurse Practitioner

## 2015-10-20 DIAGNOSIS — Z01419 Encounter for gynecological examination (general) (routine) without abnormal findings: Secondary | ICD-10-CM | POA: Insufficient documentation

## 2015-10-20 DIAGNOSIS — Z1151 Encounter for screening for human papillomavirus (HPV): Secondary | ICD-10-CM | POA: Insufficient documentation

## 2015-10-20 DIAGNOSIS — N760 Acute vaginitis: Secondary | ICD-10-CM | POA: Insufficient documentation

## 2015-10-20 DIAGNOSIS — Z113 Encounter for screening for infections with a predominantly sexual mode of transmission: Secondary | ICD-10-CM | POA: Insufficient documentation

## 2015-10-26 LAB — CYTOLOGY - PAP

## 2016-06-08 ENCOUNTER — Other Ambulatory Visit: Payer: Self-pay | Admitting: Obstetrics and Gynecology

## 2016-06-08 DIAGNOSIS — Z1231 Encounter for screening mammogram for malignant neoplasm of breast: Secondary | ICD-10-CM

## 2016-06-19 ENCOUNTER — Ambulatory Visit: Payer: BLUE CROSS/BLUE SHIELD

## 2016-07-10 ENCOUNTER — Ambulatory Visit: Payer: Self-pay | Admitting: Podiatry

## 2016-07-23 ENCOUNTER — Ambulatory Visit: Payer: BLUE CROSS/BLUE SHIELD

## 2016-07-30 ENCOUNTER — Ambulatory Visit
Admission: RE | Admit: 2016-07-30 | Discharge: 2016-07-30 | Disposition: A | Discharge status: Home / Self Care | Payer: PRIVATE HEALTH INSURANCE | Source: Ambulatory Visit | Attending: Obstetrics and Gynecology | Admitting: Obstetrics and Gynecology

## 2016-07-30 DIAGNOSIS — Z1231 Encounter for screening mammogram for malignant neoplasm of breast: Secondary | ICD-10-CM

## 2016-10-05 ENCOUNTER — Ambulatory Visit: Payer: PRIVATE HEALTH INSURANCE | Admitting: Podiatry

## 2016-10-18 ENCOUNTER — Ambulatory Visit: Payer: PRIVATE HEALTH INSURANCE | Admitting: Podiatry

## 2016-10-24 ENCOUNTER — Encounter: Payer: Self-pay | Admitting: Podiatry

## 2016-10-24 ENCOUNTER — Ambulatory Visit (INDEPENDENT_AMBULATORY_CARE_PROVIDER_SITE_OTHER): Payer: PRIVATE HEALTH INSURANCE

## 2016-10-24 ENCOUNTER — Ambulatory Visit (INDEPENDENT_AMBULATORY_CARE_PROVIDER_SITE_OTHER): Payer: PRIVATE HEALTH INSURANCE | Admitting: Podiatry

## 2016-10-24 VITALS — Resp 16 | Ht 65.0 in | Wt 175.0 lb

## 2016-10-24 DIAGNOSIS — M722 Plantar fascial fibromatosis: Secondary | ICD-10-CM | POA: Diagnosis not present

## 2016-10-24 MED ORDER — TRIAMCINOLONE ACETONIDE 10 MG/ML IJ SUSP
10.0000 mg | Freq: Once | INTRAMUSCULAR | Status: AC
  Administered 2016-10-24: 10 mg

## 2016-10-24 NOTE — Progress Notes (Signed)
   Subjective:    Patient ID: Amber Mendez, female    DOB: [birth year], 51 y.o.   MRN: [id number]  HPI Chief Complaint  Patient presents with  . Foot Pain    Right foot; bottom of heel; x6+ months; pt stated, "Wants to get another injection today"      Review of Systems  All other systems reviewed and are negative.      Objective:   Physical Exam        Assessment & Plan:

## 2016-10-24 NOTE — Patient Instructions (Signed)

## 2016-10-24 NOTE — Progress Notes (Signed)
Subjective:     Patient ID: Amber Mendez, female   DOB: [birth year], 50 y.o.   MRN: [id number]  HPI patient states it's been a while but my right heel has started to get very tender and it responded well and number of years ago to injection treatment   Review of Systems  All other systems reviewed and are negative.      Objective:   Physical Exam  Constitutional: She is oriented to person, place, and time.  Cardiovascular: Intact distal pulses.   Musculoskeletal: Normal range of motion.  Neurological: She is oriented to person, place, and time.  Skin: Skin is warm.  Nursing note and vitals reviewed.  neurovascular status intact muscle strength adequate range of motion within normal limits with patient found to have intense discomfort medial fascial band right at the insertional point tendon into the calcaneus with inflammation fluid buildup around the medial band. Also moderate depression of the arch is noted with patient found have good digital perfusion and well oriented     Assessment:     Inflammatory fasciitis right heel at the insertion calcaneus    Plan:     H&P x-rays reviewed and careful injection administered medial 3 mg Kenalog 5 mill grams Xylocaine and advised on supportive shoes and physical therapy. Reappoint if symptoms persist   X-rays indicate that there is small spur but no indications of stress fracture or arthritis

## 2017-01-08 ENCOUNTER — Other Ambulatory Visit (HOSPITAL_COMMUNITY)
Admission: RE | Admit: 2017-01-08 | Discharge: 2017-01-08 | Disposition: A | Discharge status: Home / Self Care | Payer: PRIVATE HEALTH INSURANCE | Source: Ambulatory Visit | Attending: Nurse Practitioner | Admitting: Nurse Practitioner

## 2017-01-08 ENCOUNTER — Other Ambulatory Visit: Payer: Self-pay | Admitting: Nurse Practitioner

## 2017-01-08 DIAGNOSIS — Z124 Encounter for screening for malignant neoplasm of cervix: Secondary | ICD-10-CM | POA: Diagnosis not present

## 2017-01-10 LAB — CYTOLOGY - PAP
Chlamydia: NEGATIVE
DIAGNOSIS: NEGATIVE
HPV (WINDOPATH): NOT DETECTED
Neisseria Gonorrhea: NEGATIVE
Trichomonas: NEGATIVE

## 2017-10-01 ENCOUNTER — Other Ambulatory Visit: Payer: Self-pay | Admitting: Nurse Practitioner

## 2017-10-01 DIAGNOSIS — Z1231 Encounter for screening mammogram for malignant neoplasm of breast: Secondary | ICD-10-CM

## 2017-11-13 ENCOUNTER — Encounter: Payer: Self-pay | Admitting: Podiatry

## 2017-11-13 ENCOUNTER — Ambulatory Visit: Payer: PRIVATE HEALTH INSURANCE | Admitting: Podiatry

## 2017-11-13 ENCOUNTER — Ambulatory Visit (INDEPENDENT_AMBULATORY_CARE_PROVIDER_SITE_OTHER): Payer: PRIVATE HEALTH INSURANCE

## 2017-11-13 DIAGNOSIS — M722 Plantar fascial fibromatosis: Secondary | ICD-10-CM

## 2017-11-13 MED ORDER — TRIAMCINOLONE ACETONIDE 10 MG/ML IJ SUSP
10.0000 mg | Freq: Once | INTRAMUSCULAR | Status: AC
  Administered 2017-11-13: 10 mg

## 2017-11-13 NOTE — Progress Notes (Signed)
Subjective:   Patient ID: Amber Mendez, female   DOB: 52 y.o.   MRN: [id number]   HPI Patient presents stating she started to develop a reoccurrence of her heel pain approximately 5 to 6 months ago and is gradually gotten worse over that time.  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  Patient is also noted to have pain right not as intense     Assessment:  Significant fasciitis-like symptoms left over right with structural malalignment as a part of the pathology     Plan:  H&P x-rays reviewed and today I injected the plantar fascial bilateral 3 mg Kenalog 5 mg Xylocaine and scanned for custom orthotics to reduce plantar stress on the feet.  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

## 2017-11-13 NOTE — Patient Instructions (Signed)

## 2017-11-19 ENCOUNTER — Encounter: Payer: Self-pay | Admitting: Radiology

## 2017-11-19 ENCOUNTER — Ambulatory Visit
Admission: RE | Admit: 2017-11-19 | Discharge: 2017-11-19 | Disposition: A | Discharge status: Home / Self Care | Payer: PRIVATE HEALTH INSURANCE | Source: Ambulatory Visit | Attending: Nurse Practitioner | Admitting: Nurse Practitioner

## 2017-11-19 DIAGNOSIS — Z1231 Encounter for screening mammogram for malignant neoplasm of breast: Secondary | ICD-10-CM

## 2017-12-24 ENCOUNTER — Other Ambulatory Visit: Payer: PRIVATE HEALTH INSURANCE | Admitting: Orthotics

## 2018-01-10 ENCOUNTER — Ambulatory Visit: Payer: PRIVATE HEALTH INSURANCE

## 2018-03-21 ENCOUNTER — Ambulatory Visit (INDEPENDENT_AMBULATORY_CARE_PROVIDER_SITE_OTHER): Payer: PRIVATE HEALTH INSURANCE | Admitting: Podiatry

## 2018-03-21 ENCOUNTER — Encounter: Payer: Self-pay | Admitting: Podiatry

## 2018-03-21 DIAGNOSIS — M722 Plantar fascial fibromatosis: Secondary | ICD-10-CM

## 2018-03-21 MED ORDER — TRIAMCINOLONE ACETONIDE 10 MG/ML IJ SUSP
10.0000 mg | Freq: Once | INTRAMUSCULAR | Status: AC
  Administered 2018-03-21: 10 mg

## 2018-03-21 NOTE — Patient Instructions (Signed)
Pre-Operative Instructions  Congratulations, you have decided to take an important step towards improving your quality of life.  You can be assured that the doctors and staff at [facility] will be with you every step of the way.  Here are some important things you should know:  1. Plan to be at the surgery center/hospital at least 1 (one) hour prior to your scheduled time, unless otherwise directed by the surgical center/hospital staff.  You must have a responsible adult accompany you, remain during the surgery and drive you home.  Make sure you have directions to the surgical center/hospital to ensure you arrive on time. 2. If you are having surgery at [facility] or [facility], you will need a copy of your medical history and physical form from your family physician within one month prior to the date of surgery. We will give you a form for your primary physician to complete.  3. We make every effort to accommodate the date you request for surgery.  However, there are times where surgery dates or times have to be moved.  We will contact you as soon as possible if a change in schedule is required.   4. No aspirin/ibuprofen for one week before surgery.  If you are on aspirin, any non-steroidal anti-inflammatory medications (Mobic, Aleve, Ibuprofen) should not be taken seven (7) days prior to your surgery.  You make take Tylenol for pain prior to surgery.  5. Medications - If you are taking daily heart and blood pressure medications, seizure, reflux, allergy, asthma, anxiety, pain or diabetes medications, make sure you notify the surgery center/hospital before the day of surgery so they can tell you which medications you should take or avoid the day of surgery. 6. No food or drink after midnight the night before surgery unless directed otherwise by surgical center/hospital staff. 7. No alcoholic beverages 24-hours prior to surgery.  No smoking 24-hours prior or 24-hours after  surgery. 8. Wear loose pants or shorts. They should be loose enough to fit over bandages, boots, and casts. 9. Don't wear slip-on shoes. Sneakers are preferred. 10. Bring your boot with you to the surgery center/hospital.  Also bring crutches or a walker if your physician has prescribed it for you.  If you do not have this equipment, it will be provided for you after surgery. 11. If you have not been contacted by the surgery center/hospital by the day before your surgery, call to confirm the date and time of your surgery. 12. Leave-time from work may vary depending on the type of surgery you have.  Appropriate arrangements should be made prior to surgery with your employer. 13. Prescriptions will be provided immediately following surgery by your doctor.  Fill these as soon as possible after surgery and take the medication as directed. Pain medications will not be refilled on weekends and must be approved by the doctor. 14. Remove nail polish on the operative foot and avoid getting pedicures prior to surgery. 15. Wash the night before surgery.  The night before surgery wash the foot and leg well with water and the antibacterial soap provided. Be sure to pay special attention to beneath the toenails and in between the toes.  Wash for at least three (3) minutes. Rinse thoroughly with water and dry well with a towel.  Perform this wash unless told not to do so by your physician.  Enclosed: 1 Ice pack (please put in freezer the night before surgery)   1 Hibiclens skin cleaner     Pre-op instructions  If you have any questions regarding the instructions, please do not hesitate to call our office.  [place]: [address], [place], [place] [address] -- [phone]  [place]: [address] [place], [place] [address] -- [phone]  [place]: [address]  [place], [place] [address] -- [phone]  [place]: [address], [address], [place], [place] [address] -- [phone]  Website:  [email or web address] 

## 2018-03-22 NOTE — Progress Notes (Signed)
Subjective:   Patient ID: Amber Mendez, female   DOB: 52 y.o.   MRN: [id number]   HPI Patient presents stating my heel has just been awful and is never really gotten better.  Patient states the right one is also starting to hurt her and this is so frustrated because she cannot be active or do any form of activity and every time she tries to just gets worse.  States her heel has been hurting her for at least a year   ROS      Objective:  Physical Exam  Neurovascular status intact with intense discomfort medial fascial band left with moderate pain right and a long-term history of this with depressed arches noted bilateral.  Patient does walk with a compensated gait pattern     Assessment:  Chronic plantar fasciitis of the heel left over right that has failed to respond to numerous conservative treatments     Plan:  H&P condition reviewed at great length and due to the long-term nature of this problem and her continued discomfort I recommended surgical endoscopic procedure.  Patient wants to get this done but needs to wait several months due to her schedule and wants temporary relief of her pain and today I did go ahead and I injected the plantar fascia 3 mg Kenalog 5 mg Xylocaine.  She does not want another co-pay so I went ahead today and I allowed her to do the consent form and I reviewed consent form going over alternative treatments complications and everything associated with endoscopic surgery.  She is comfortable with this wants procedure signed consent form and is encouraged to call us with any questions prior to the procedure.  I then dispensed air fracture walker for her to start wearing now to reduce the stress on the heel and to wear after the surgical procedure.  X-rays indicate moderate depression of the arch with spur formation bilateral heel

## 2018-05-01 ENCOUNTER — Telehealth: Payer: Self-pay | Admitting: Podiatry

## 2018-05-01 NOTE — Telephone Encounter (Signed)
I'm scheduled for surgery on 17 December and I need to speak to someone about my FMLA papers and how soon do I need to drop them off? I can be reached at [phone]. Thank you.

## 2018-05-02 NOTE — Telephone Encounter (Signed)
I'm calling in regards to my surgery scheduled for 17 December. Please call me back at [phone]. I was told I would receive a phone call but I have not received one yet.

## 2018-05-06 ENCOUNTER — Telehealth: Payer: Self-pay | Admitting: *Deleted

## 2018-05-06 NOTE — Telephone Encounter (Signed)
I am returning your call.  How can I help you?  "I appreciate you calling me back.  Sheppard Evens has helped me out tremendously.  I kept calling trying to reach you and I kept being sent to your voicemail.  Nobody was telling me anything, just sending me to your voicemail.  I finally talked to Sheppard Evens.  She took the time to help me.  She told me that you were out of the office for the rest of the week.  I appreciate her telling me because I hate to keep blowing up somebody's voicemail with saying the same thing.  I am going to make sure I put something good about her on that survey because she went above and beyond to help me.  She answered most of my questions.  I need to find out how much I will owe for my surgery."  I will have Jocelyn Lamer in our insurance department to give you a call and give you an estimate.  You will need to call the surgical center to get the cost for the facility and for the anesthesia.  "Okay, they are all separate.  I started registering on-line, that's a long process.  Someone will call and give me my arrival time, correct?"  Yes, someone will call you either the Friday or Monday from the surgical center.  They will give you your arrival time.  "Thank you so much for your help."  (She's having an EPF med band left) - 30 mins.

## 2018-05-06 NOTE — Telephone Encounter (Signed)
"  I'm calling concerning my surgery date, December 17.  If you could give me a call back.  I'm scheduled for December 17 with Dr. Paulla Dolly.  Give me a call back at your earliest convenience."

## 2018-05-30 DIAGNOSIS — M79676 Pain in unspecified toe(s): Secondary | ICD-10-CM

## 2018-06-24 ENCOUNTER — Encounter: Payer: Self-pay | Admitting: Podiatry

## 2018-06-24 DIAGNOSIS — M722 Plantar fascial fibromatosis: Secondary | ICD-10-CM

## 2018-06-30 ENCOUNTER — Encounter: Payer: Self-pay | Admitting: Podiatry

## 2018-06-30 ENCOUNTER — Ambulatory Visit (INDEPENDENT_AMBULATORY_CARE_PROVIDER_SITE_OTHER): Payer: PRIVATE HEALTH INSURANCE | Admitting: Podiatry

## 2018-06-30 VITALS — BP 97/64 | HR 74

## 2018-06-30 DIAGNOSIS — M722 Plantar fascial fibromatosis: Secondary | ICD-10-CM

## 2018-07-01 NOTE — Progress Notes (Signed)
Subjective:   Patient ID: Amber Mendez, female   DOB: 52 y.o.   MRN: [id number]   HPI Patient presents stating doing very well with minimal discomfort and able to walk and would like to get the right one done hopefully in the next few weeks   ROS      Objective:  Physical Exam  Neurovascular status intact negative Homans sign noted wound edges well coapted medial lateral side of the left heel with minimal plantar pain     Assessment:  Doing well post endoscopic fasciotomy left     Plan:  Sterile dressing reapplied and advised on continued elevation compression and dispensed surgical shoe which she may begin wearing at home.  Reappoint 2 weeks suture removal earlier if needed

## 2018-07-14 ENCOUNTER — Ambulatory Visit (INDEPENDENT_AMBULATORY_CARE_PROVIDER_SITE_OTHER): Payer: PRIVATE HEALTH INSURANCE | Admitting: Podiatry

## 2018-07-14 ENCOUNTER — Encounter: Payer: Self-pay | Admitting: Podiatry

## 2018-07-14 DIAGNOSIS — M722 Plantar fascial fibromatosis: Secondary | ICD-10-CM | POA: Diagnosis not present

## 2018-07-16 NOTE — Progress Notes (Signed)
Subjective:   Patient ID: Amber Mendez, female   DOB: 53 y.o.   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  Advised on continued ice continue boot usage with gradual reduction in shoe gear usage but I did explain this will probably take another 8 to 12 weeks to heal completely and she is slated to go back to work in mid February and hopefully will be able to do this.  She is encouraged to call us with any questions

## 2018-08-20 ENCOUNTER — Encounter: Payer: Self-pay | Admitting: Podiatry

## 2018-08-20 ENCOUNTER — Other Ambulatory Visit: Payer: Self-pay | Admitting: Podiatry

## 2018-08-20 ENCOUNTER — Encounter: Payer: PRIVATE HEALTH INSURANCE | Admitting: Podiatry

## 2018-08-20 ENCOUNTER — Ambulatory Visit (INDEPENDENT_AMBULATORY_CARE_PROVIDER_SITE_OTHER): Payer: PRIVATE HEALTH INSURANCE | Admitting: Podiatry

## 2018-08-20 DIAGNOSIS — M722 Plantar fascial fibromatosis: Secondary | ICD-10-CM

## 2018-08-20 MED ORDER — DICLOFENAC SODIUM 75 MG PO TBEC: 75.0000 mg | DELAYED_RELEASE_TABLET | Freq: Two times a day (BID) | ORAL | 2 refills | Status: DC

## 2018-08-24 NOTE — Progress Notes (Signed)
Subjective:   Patient ID: Amber Mendez, female   DOB: 53 y.o.   MRN: [id number]   HPI Patient is concerned about still having some discomfort in the left heel and states it is worse when she weight bears but overall she seems to be in a continued improvement status   ROS      Objective:  Physical Exam  Neurovascular status intact with patient's plantar left heel improved but still present with deep palpation but overall much better than preoperatively     Assessment:  Fasciitis is still present but gradual improvement occurring     Plan:  Reviewed normal the normalcy of this given her postoperative.  And that I want her to continue to increase her activity level and she will be seen back as needed and at this point is allowed to return to work signed visit

## 2019-12-18 DIAGNOSIS — Z79899 Other long term (current) drug therapy: Secondary | ICD-10-CM | POA: Diagnosis not present

## 2019-12-18 DIAGNOSIS — I10 Essential (primary) hypertension: Secondary | ICD-10-CM | POA: Diagnosis not present

## 2019-12-18 DIAGNOSIS — E7800 Pure hypercholesterolemia, unspecified: Secondary | ICD-10-CM | POA: Diagnosis not present

## 2019-12-18 DIAGNOSIS — R5383 Other fatigue: Secondary | ICD-10-CM | POA: Diagnosis not present

## 2019-12-18 DIAGNOSIS — E559 Vitamin D deficiency, unspecified: Secondary | ICD-10-CM | POA: Diagnosis not present

## 2019-12-18 DIAGNOSIS — F411 Generalized anxiety disorder: Secondary | ICD-10-CM | POA: Diagnosis not present

## 2020-01-06 ENCOUNTER — Other Ambulatory Visit: Payer: Self-pay | Admitting: Nurse Practitioner

## 2020-01-06 DIAGNOSIS — Z1231 Encounter for screening mammogram for malignant neoplasm of breast: Secondary | ICD-10-CM

## 2020-01-27 ENCOUNTER — Ambulatory Visit: Payer: PRIVATE HEALTH INSURANCE

## 2020-02-01 ENCOUNTER — Ambulatory Visit: Payer: PRIVATE HEALTH INSURANCE

## 2020-02-08 ENCOUNTER — Ambulatory Visit
Admission: RE | Admit: 2020-02-08 | Discharge: 2020-02-08 | Disposition: A | Discharge status: Home / Self Care | Payer: 59 | Source: Ambulatory Visit | Attending: Nurse Practitioner | Admitting: Nurse Practitioner

## 2020-02-08 ENCOUNTER — Other Ambulatory Visit: Payer: Self-pay

## 2020-02-08 DIAGNOSIS — F411 Generalized anxiety disorder: Secondary | ICD-10-CM | POA: Diagnosis not present

## 2020-02-08 DIAGNOSIS — Z1231 Encounter for screening mammogram for malignant neoplasm of breast: Secondary | ICD-10-CM

## 2020-02-08 DIAGNOSIS — Z113 Encounter for screening for infections with a predominantly sexual mode of transmission: Secondary | ICD-10-CM | POA: Diagnosis not present

## 2020-02-08 DIAGNOSIS — Z01419 Encounter for gynecological examination (general) (routine) without abnormal findings: Secondary | ICD-10-CM | POA: Diagnosis not present

## 2020-03-09 DIAGNOSIS — N898 Other specified noninflammatory disorders of vagina: Secondary | ICD-10-CM | POA: Diagnosis not present

## 2020-03-09 DIAGNOSIS — M25511 Pain in right shoulder: Secondary | ICD-10-CM | POA: Diagnosis not present

## 2020-03-09 DIAGNOSIS — R300 Dysuria: Secondary | ICD-10-CM | POA: Diagnosis not present

## 2020-03-25 DIAGNOSIS — M7581 Other shoulder lesions, right shoulder: Secondary | ICD-10-CM | POA: Diagnosis not present

## 2020-04-07 DIAGNOSIS — E559 Vitamin D deficiency, unspecified: Secondary | ICD-10-CM | POA: Diagnosis not present

## 2020-04-07 DIAGNOSIS — Z79899 Other long term (current) drug therapy: Secondary | ICD-10-CM | POA: Diagnosis not present

## 2020-04-07 DIAGNOSIS — Z0000 Encounter for general adult medical examination without abnormal findings: Secondary | ICD-10-CM | POA: Diagnosis not present

## 2020-04-07 DIAGNOSIS — I10 Essential (primary) hypertension: Secondary | ICD-10-CM | POA: Diagnosis not present

## 2020-04-07 DIAGNOSIS — F411 Generalized anxiety disorder: Secondary | ICD-10-CM | POA: Diagnosis not present

## 2020-04-07 DIAGNOSIS — R300 Dysuria: Secondary | ICD-10-CM | POA: Diagnosis not present

## 2020-04-07 DIAGNOSIS — E7800 Pure hypercholesterolemia, unspecified: Secondary | ICD-10-CM | POA: Diagnosis not present

## 2020-04-12 ENCOUNTER — Other Ambulatory Visit (HOSPITAL_COMMUNITY): Payer: Self-pay | Admitting: Nurse Practitioner

## 2020-04-12 MED FILL — ALPRAZolam 1 MG TABS: 1 | 90 days supply | Qty: 90 | Fill #0

## 2020-05-29 ENCOUNTER — Other Ambulatory Visit (HOSPITAL_COMMUNITY): Payer: Self-pay

## 2020-05-30 MED FILL — NITROFURANTOIN MONO-MCR 100: 100 | 5 days supply | Qty: 10 | Fill #0

## 2020-07-06 ENCOUNTER — Other Ambulatory Visit (HOSPITAL_COMMUNITY): Payer: Self-pay | Admitting: Obstetrics and Gynecology

## 2020-07-07 ENCOUNTER — Other Ambulatory Visit (HOSPITAL_COMMUNITY): Payer: Self-pay | Admitting: Gastroenterology

## 2020-07-11 DIAGNOSIS — M7581 Other shoulder lesions, right shoulder: Secondary | ICD-10-CM | POA: Diagnosis not present

## 2020-07-16 DIAGNOSIS — H524 Presbyopia: Secondary | ICD-10-CM | POA: Diagnosis not present

## 2020-07-20 MED FILL — ALPRAZolam 1 MG TABS: 1 | 30 days supply | Qty: 30 | Fill #0

## 2020-08-11 ENCOUNTER — Other Ambulatory Visit (HOSPITAL_COMMUNITY): Payer: Self-pay | Admitting: Obstetrics and Gynecology

## 2020-08-18 DIAGNOSIS — M62830 Muscle spasm of back: Secondary | ICD-10-CM | POA: Diagnosis not present

## 2020-08-18 DIAGNOSIS — M5416 Radiculopathy, lumbar region: Secondary | ICD-10-CM | POA: Diagnosis not present

## 2020-08-18 DIAGNOSIS — M9903 Segmental and somatic dysfunction of lumbar region: Secondary | ICD-10-CM | POA: Diagnosis not present

## 2020-08-18 DIAGNOSIS — M9901 Segmental and somatic dysfunction of cervical region: Secondary | ICD-10-CM | POA: Diagnosis not present

## 2020-08-18 DIAGNOSIS — M531 Cervicobrachial syndrome: Secondary | ICD-10-CM | POA: Diagnosis not present

## 2020-08-18 DIAGNOSIS — M9902 Segmental and somatic dysfunction of thoracic region: Secondary | ICD-10-CM | POA: Diagnosis not present

## 2020-08-18 DIAGNOSIS — M9905 Segmental and somatic dysfunction of pelvic region: Secondary | ICD-10-CM | POA: Diagnosis not present

## 2020-09-07 ENCOUNTER — Other Ambulatory Visit (HOSPITAL_COMMUNITY): Payer: Self-pay | Admitting: Psychiatry

## 2020-09-07 DIAGNOSIS — F411 Generalized anxiety disorder: Secondary | ICD-10-CM | POA: Diagnosis not present

## 2020-09-08 MED FILL — busPIRone HCL 10 MG TABS: 10 | 30 days supply | Qty: 60 | Fill #0

## 2020-09-08 MED FILL — ALPRAZolam 1 MG TABS: 1 | 30 days supply | Qty: 45 | Fill #0

## 2020-09-13 ENCOUNTER — Other Ambulatory Visit (HOSPITAL_COMMUNITY): Payer: Self-pay | Admitting: Geriatric Medicine

## 2020-09-13 DIAGNOSIS — R5383 Other fatigue: Secondary | ICD-10-CM | POA: Diagnosis not present

## 2020-09-13 DIAGNOSIS — F411 Generalized anxiety disorder: Secondary | ICD-10-CM | POA: Diagnosis not present

## 2020-09-13 DIAGNOSIS — I10 Essential (primary) hypertension: Secondary | ICD-10-CM | POA: Diagnosis not present

## 2020-09-14 DIAGNOSIS — R300 Dysuria: Secondary | ICD-10-CM | POA: Diagnosis not present

## 2020-09-14 MED FILL — TRIAMTERENE/HCTZ 37.5/25 CP: 37.5-25 | 90 days supply | Qty: 90 | Fill #0

## 2020-09-15 ENCOUNTER — Other Ambulatory Visit (HOSPITAL_COMMUNITY): Payer: Self-pay | Admitting: Geriatric Medicine

## 2020-09-15 MED FILL — SULFAMETHOXAZOLE-TMP DS TAB: 800-160 | 5 days supply | Qty: 10 | Fill #0

## 2020-09-20 DIAGNOSIS — Z1159 Encounter for screening for other viral diseases: Secondary | ICD-10-CM | POA: Diagnosis not present

## 2020-09-23 DIAGNOSIS — D120 Benign neoplasm of cecum: Secondary | ICD-10-CM | POA: Diagnosis not present

## 2020-09-23 DIAGNOSIS — K648 Other hemorrhoids: Secondary | ICD-10-CM | POA: Diagnosis not present

## 2020-09-23 DIAGNOSIS — K635 Polyp of colon: Secondary | ICD-10-CM | POA: Diagnosis not present

## 2020-09-23 DIAGNOSIS — Z1211 Encounter for screening for malignant neoplasm of colon: Secondary | ICD-10-CM | POA: Diagnosis not present

## 2020-10-18 ENCOUNTER — Other Ambulatory Visit (HOSPITAL_COMMUNITY): Payer: Self-pay

## 2020-10-18 DIAGNOSIS — F411 Generalized anxiety disorder: Secondary | ICD-10-CM | POA: Diagnosis not present

## 2020-10-18 MED ORDER — ALPRAZOLAM 1 MG PO TABS
0.5000 mg | ORAL_TABLET | Freq: Every day | ORAL | 1 refills | Status: AC
  Filled 2020-10-18: qty 45, 30d supply, fill #0

## 2020-10-18 MED ORDER — GABAPENTIN 100 MG PO CAPS
100.0000 mg | ORAL_CAPSULE | Freq: Three times a day (TID) | ORAL | 0 refills | Status: AC
  Filled 2020-10-18: qty 90, 30d supply, fill #0

## 2020-10-19 ENCOUNTER — Other Ambulatory Visit (HOSPITAL_COMMUNITY): Payer: Self-pay

## 2020-10-20 ENCOUNTER — Other Ambulatory Visit (HOSPITAL_COMMUNITY): Payer: Self-pay

## 2020-10-27 DIAGNOSIS — N898 Other specified noninflammatory disorders of vagina: Secondary | ICD-10-CM | POA: Diagnosis not present

## 2020-10-31 ENCOUNTER — Other Ambulatory Visit (HOSPITAL_COMMUNITY): Payer: Self-pay

## 2020-10-31 MED ORDER — METRONIDAZOLE 500 MG PO TABS
500.0000 mg | ORAL_TABLET | Freq: Two times a day (BID) | ORAL | 0 refills | Status: AC
  Filled 2020-10-31: qty 14, 7d supply, fill #0

## 2020-11-16 ENCOUNTER — Other Ambulatory Visit (HOSPITAL_COMMUNITY): Payer: Self-pay

## 2020-11-16 DIAGNOSIS — F411 Generalized anxiety disorder: Secondary | ICD-10-CM | POA: Diagnosis not present

## 2020-11-16 MED ORDER — ALPRAZOLAM 1 MG PO TABS
ORAL_TABLET | ORAL | 1 refills | Status: AC
  Filled 2020-11-16 (×2): qty 45, 30d supply, fill #0

## 2020-11-16 MED ORDER — GABAPENTIN 100 MG PO CAPS
100.0000 mg | ORAL_CAPSULE | Freq: Three times a day (TID) | ORAL | 0 refills | Status: AC | PRN
  Filled 2020-11-16: qty 90, 30d supply, fill #0

## 2020-11-16 MED ORDER — DULOXETINE HCL 30 MG PO CPEP
30.0000 mg | ORAL_CAPSULE | Freq: Every evening | ORAL | 0 refills | Status: AC | PRN
  Filled 2020-11-16: qty 30, 30d supply, fill #0

## 2020-11-17 ENCOUNTER — Other Ambulatory Visit (HOSPITAL_COMMUNITY): Payer: Self-pay

## 2020-12-14 ENCOUNTER — Other Ambulatory Visit (HOSPITAL_COMMUNITY): Payer: Self-pay

## 2020-12-14 MED ORDER — DULOXETINE HCL 60 MG PO CPEP
60.0000 mg | ORAL_CAPSULE | Freq: Every day | ORAL | 1 refills | Status: AC
  Filled 2020-12-14: qty 30, 30d supply, fill #0

## 2020-12-14 MED ORDER — ALPRAZOLAM 1 MG PO TABS
ORAL_TABLET | ORAL | 1 refills | Status: AC
  Filled 2020-12-14: qty 45, 30d supply, fill #0

## 2020-12-14 MED ORDER — GABAPENTIN 100 MG PO CAPS
100.0000 mg | ORAL_CAPSULE | Freq: Three times a day (TID) | ORAL | 1 refills | Status: AC
  Filled 2020-12-14: qty 90, 30d supply, fill #0

## 2020-12-22 ENCOUNTER — Other Ambulatory Visit (HOSPITAL_COMMUNITY): Payer: Self-pay

## 2020-12-30 ENCOUNTER — Other Ambulatory Visit (HOSPITAL_COMMUNITY): Payer: Self-pay

## 2020-12-30 MED FILL — Triamterene & Hydrochlorothiazide Cap 37.5-25 MG: ORAL | 90 days supply | Qty: 90 | Fill #0 | Status: AC

## 2021-01-19 ENCOUNTER — Other Ambulatory Visit: Payer: Self-pay | Admitting: Obstetrics and Gynecology

## 2021-01-19 DIAGNOSIS — Z1231 Encounter for screening mammogram for malignant neoplasm of breast: Secondary | ICD-10-CM

## 2021-01-25 ENCOUNTER — Other Ambulatory Visit (HOSPITAL_COMMUNITY): Payer: Self-pay

## 2021-01-25 MED ORDER — DULOXETINE HCL 30 MG PO CPEP
ORAL_CAPSULE | ORAL | 1 refills | Status: AC
  Filled 2021-01-25: qty 30, 30d supply, fill #0

## 2021-01-25 MED ORDER — GABAPENTIN 100 MG PO CAPS
100.0000 mg | ORAL_CAPSULE | Freq: Three times a day (TID) | ORAL | 1 refills | Status: AC
  Filled 2021-01-25: qty 90, 30d supply, fill #0

## 2021-01-25 MED ORDER — ALPRAZOLAM 1 MG PO TABS
ORAL_TABLET | ORAL | 1 refills | Status: AC
  Filled 2021-01-25 – 2021-02-27 (×2): qty 45, 30d supply, fill #0
  Filled 2021-05-26 – 2021-06-06 (×2): qty 45, 30d supply, fill #1

## 2021-02-02 ENCOUNTER — Other Ambulatory Visit (HOSPITAL_COMMUNITY): Payer: Self-pay

## 2021-02-07 ENCOUNTER — Other Ambulatory Visit: Payer: Self-pay | Admitting: Nurse Practitioner

## 2021-02-07 DIAGNOSIS — R102 Pelvic and perineal pain: Secondary | ICD-10-CM

## 2021-02-08 ENCOUNTER — Ambulatory Visit
Admission: RE | Admit: 2021-02-08 | Discharge: 2021-02-08 | Disposition: A | Discharge status: Home / Self Care | Payer: 59 | Source: Ambulatory Visit | Attending: Nurse Practitioner | Admitting: Nurse Practitioner

## 2021-02-08 DIAGNOSIS — R102 Pelvic and perineal pain: Secondary | ICD-10-CM

## 2021-02-09 ENCOUNTER — Ambulatory Visit
Admission: RE | Admit: 2021-02-09 | Discharge: 2021-02-09 | Disposition: A | Discharge status: Home / Self Care | Payer: Self-pay | Source: Ambulatory Visit | Attending: Obstetrics and Gynecology | Admitting: Obstetrics and Gynecology

## 2021-02-09 ENCOUNTER — Other Ambulatory Visit: Payer: Self-pay

## 2021-02-09 DIAGNOSIS — Z1231 Encounter for screening mammogram for malignant neoplasm of breast: Secondary | ICD-10-CM

## 2021-02-27 ENCOUNTER — Other Ambulatory Visit (HOSPITAL_COMMUNITY): Payer: Self-pay

## 2021-03-10 ENCOUNTER — Other Ambulatory Visit (HOSPITAL_COMMUNITY): Payer: Self-pay

## 2021-03-10 MED ORDER — SULFAMETHOXAZOLE-TRIMETHOPRIM 800-160 MG PO TABS
1.0000 | ORAL_TABLET | Freq: Two times a day (BID) | ORAL | 0 refills | Status: AC
  Filled 2021-03-10: qty 14, 7d supply, fill #0

## 2021-03-17 ENCOUNTER — Ambulatory Visit: Payer: 59

## 2021-04-07 ENCOUNTER — Other Ambulatory Visit (HOSPITAL_COMMUNITY): Payer: Self-pay

## 2021-04-07 MED FILL — Triamterene & Hydrochlorothiazide Cap 37.5-25 MG: ORAL | 90 days supply | Qty: 90 | Fill #1 | Status: CN

## 2021-04-17 ENCOUNTER — Other Ambulatory Visit (HOSPITAL_COMMUNITY): Payer: Self-pay

## 2021-04-24 ENCOUNTER — Other Ambulatory Visit (HOSPITAL_COMMUNITY): Payer: Self-pay

## 2021-04-24 MED FILL — Triamterene & Hydrochlorothiazide Cap 37.5-25 MG: ORAL | 90 days supply | Qty: 90 | Fill #1 | Status: AC

## 2021-05-26 ENCOUNTER — Other Ambulatory Visit (HOSPITAL_COMMUNITY): Payer: Self-pay

## 2021-06-05 ENCOUNTER — Other Ambulatory Visit (HOSPITAL_COMMUNITY): Payer: Self-pay

## 2021-06-06 ENCOUNTER — Other Ambulatory Visit (HOSPITAL_COMMUNITY): Payer: Self-pay

## 2021-06-09 ENCOUNTER — Other Ambulatory Visit (HOSPITAL_COMMUNITY): Payer: Self-pay

## 2021-06-14 ENCOUNTER — Other Ambulatory Visit (HOSPITAL_COMMUNITY): Payer: Self-pay

## 2021-09-01 ENCOUNTER — Other Ambulatory Visit (HOSPITAL_COMMUNITY): Payer: Self-pay

## 2021-09-05 ENCOUNTER — Other Ambulatory Visit: Payer: Self-pay | Admitting: Geriatric Medicine

## 2021-09-13 ENCOUNTER — Other Ambulatory Visit (HOSPITAL_COMMUNITY): Payer: Self-pay

## 2021-09-13 MED ORDER — HYDROXYZINE HCL 10 MG PO TABS
10.0000 mg | ORAL_TABLET | Freq: Three times a day (TID) | ORAL | 0 refills | Status: DC | PRN
  Filled 2021-09-13: qty 90, 30d supply, fill #0

## 2021-09-13 MED ORDER — ESCITALOPRAM OXALATE 10 MG PO TABS
10.0000 mg | ORAL_TABLET | Freq: Every evening | ORAL | 0 refills | Status: DC
  Filled 2021-09-13: qty 30, 30d supply, fill #0

## 2021-09-21 ENCOUNTER — Other Ambulatory Visit (HOSPITAL_COMMUNITY): Payer: Self-pay

## 2021-10-11 ENCOUNTER — Other Ambulatory Visit (HOSPITAL_COMMUNITY): Payer: Self-pay

## 2021-10-11 MED ORDER — LAMOTRIGINE 25 MG PO TABS
25.0000 mg | ORAL_TABLET | Freq: Every day | ORAL | 0 refills | Status: AC
  Filled 2021-10-11: qty 30, 30d supply, fill #0

## 2021-10-11 MED ORDER — ESCITALOPRAM OXALATE 10 MG PO TABS
10.0000 mg | ORAL_TABLET | Freq: Every day | ORAL | 0 refills | Status: AC
  Filled 2021-10-11: qty 30, 30d supply, fill #0

## 2021-10-11 MED ORDER — HYDROXYZINE HCL 10 MG PO TABS
10.0000 mg | ORAL_TABLET | Freq: Three times a day (TID) | ORAL | 0 refills | Status: AC | PRN
Start: 2021-10-11 — End: ?
  Filled 2021-10-11: qty 90, 30d supply, fill #0

## 2022-02-21 ENCOUNTER — Ambulatory Visit
Admission: RE | Admit: 2022-02-21 | Discharge: 2022-02-21 | Disposition: A | Discharge status: Home / Self Care | Payer: Commercial Managed Care - HMO | Source: Ambulatory Visit | Attending: Obstetrics and Gynecology | Admitting: Obstetrics and Gynecology

## 2022-02-21 ENCOUNTER — Other Ambulatory Visit: Payer: Self-pay | Admitting: Obstetrics and Gynecology

## 2022-02-21 DIAGNOSIS — Z1231 Encounter for screening mammogram for malignant neoplasm of breast: Secondary | ICD-10-CM

## 2022-02-23 ENCOUNTER — Other Ambulatory Visit: Payer: Self-pay | Admitting: Obstetrics and Gynecology

## 2022-02-23 DIAGNOSIS — R928 Other abnormal and inconclusive findings on diagnostic imaging of breast: Secondary | ICD-10-CM

## 2022-02-27 ENCOUNTER — Other Ambulatory Visit (HOSPITAL_COMMUNITY): Payer: Self-pay

## 2022-02-27 MED ORDER — FLUCONAZOLE 150 MG PO TABS
150.0000 mg | ORAL_TABLET | ORAL | 0 refills | Status: AC
  Filled 2022-02-27: qty 2, 4d supply, fill #0

## 2022-03-22 ENCOUNTER — Other Ambulatory Visit (HOSPITAL_BASED_OUTPATIENT_CLINIC_OR_DEPARTMENT_OTHER): Payer: Self-pay

## 2022-03-26 ENCOUNTER — Other Ambulatory Visit: Payer: Commercial Managed Care - HMO

## 2022-03-26 ENCOUNTER — Inpatient Hospital Stay: Admission: RE | Admit: 2022-03-26 | Payer: Commercial Managed Care - HMO | Source: Ambulatory Visit

## 2022-04-16 ENCOUNTER — Ambulatory Visit
Admission: RE | Admit: 2022-04-16 | Discharge: 2022-04-16 | Disposition: A | Discharge status: Home / Self Care | Payer: No Typology Code available for payment source | Source: Ambulatory Visit | Attending: Obstetrics and Gynecology | Admitting: Obstetrics and Gynecology

## 2022-04-16 ENCOUNTER — Ambulatory Visit: Payer: Commercial Managed Care - HMO

## 2022-04-16 DIAGNOSIS — R928 Other abnormal and inconclusive findings on diagnostic imaging of breast: Secondary | ICD-10-CM

## 2022-07-02 IMAGING — MG DIGITAL SCREENING BILAT W/ TOMO W/ CAD
8 series · 8 of 24 positions shown · non-contrast
Comparison: Previous exam(s).

CLINICAL DATA: Screening.

EXAM:
DIGITAL SCREENING BILATERAL MAMMOGRAM WITH TOMO AND CAD

[R CC synth-2D]
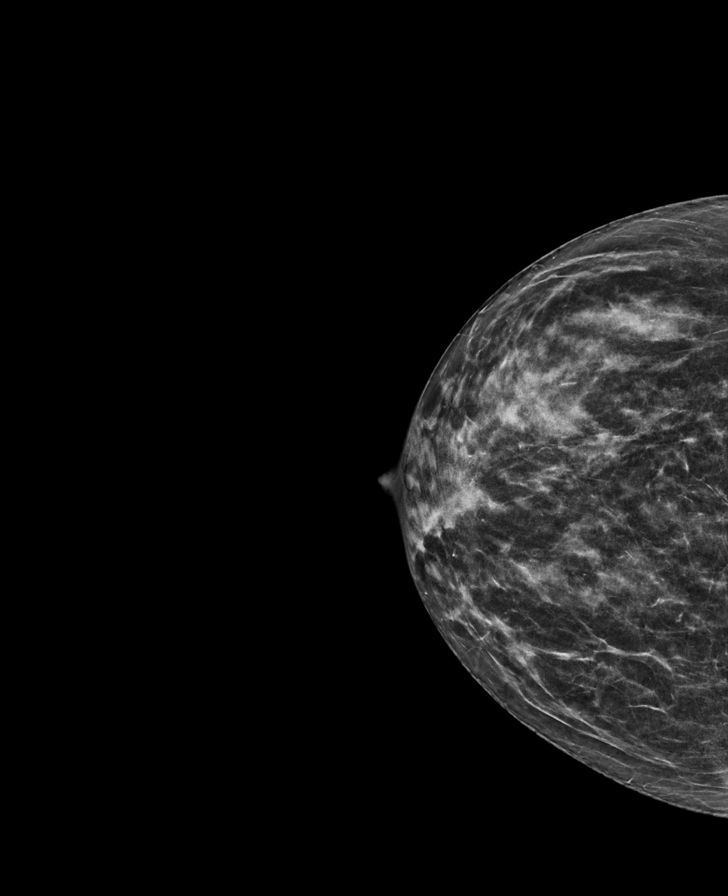

[L CC synth-2D]
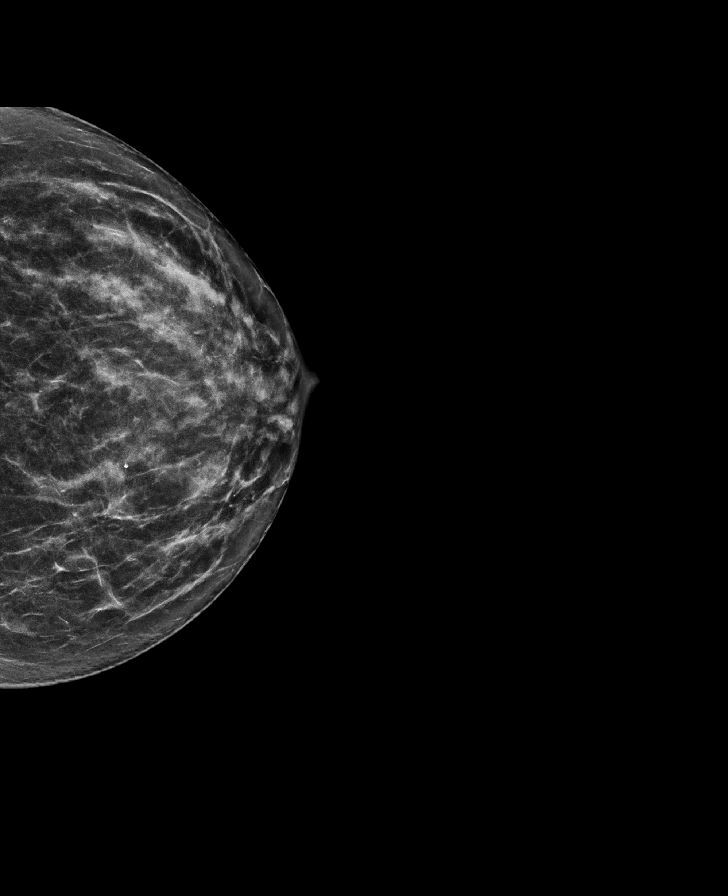

[L MLO synth-2D]
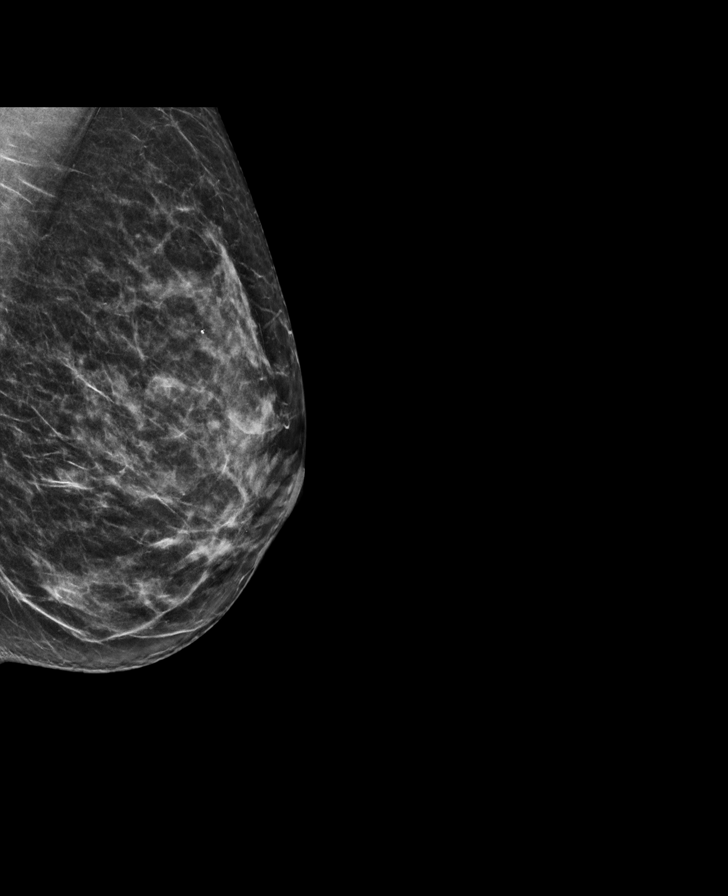

[R MLO synth-2D]
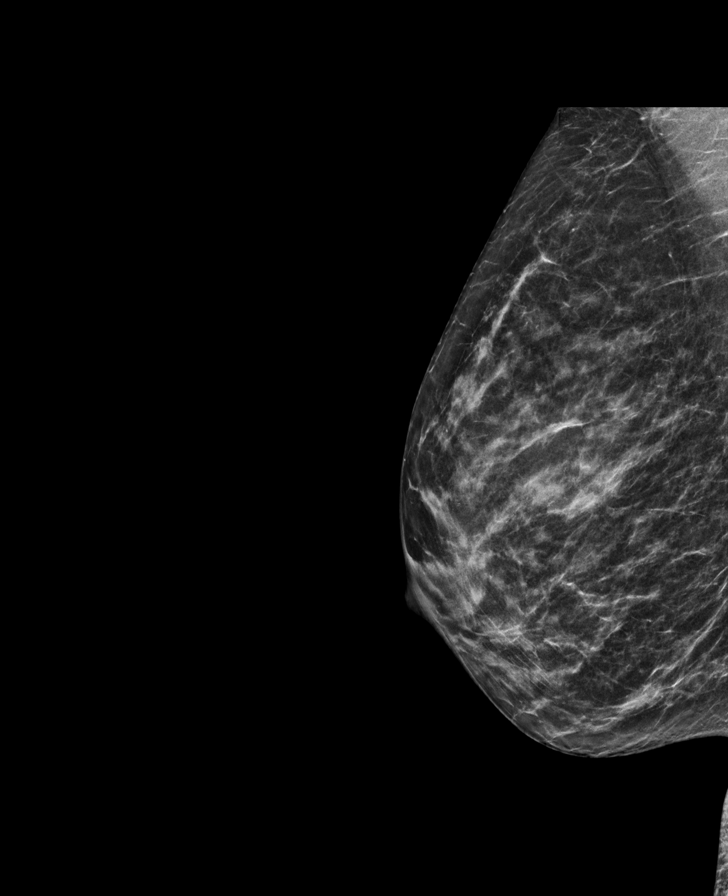

[L CC tomo · tomo slice 32/63.0]
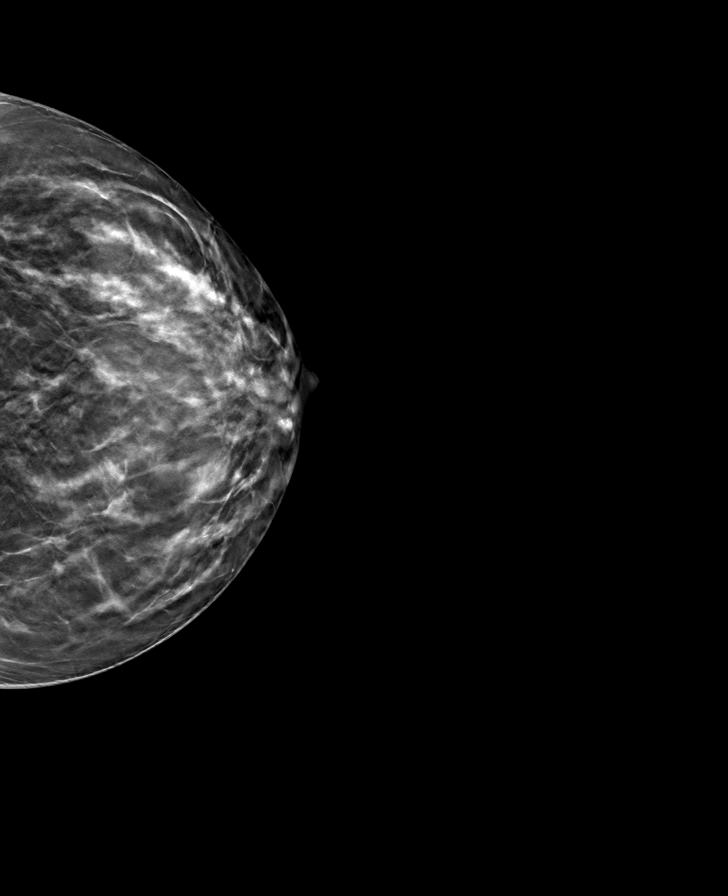

[R CC tomo · tomo slice 28/55.0]
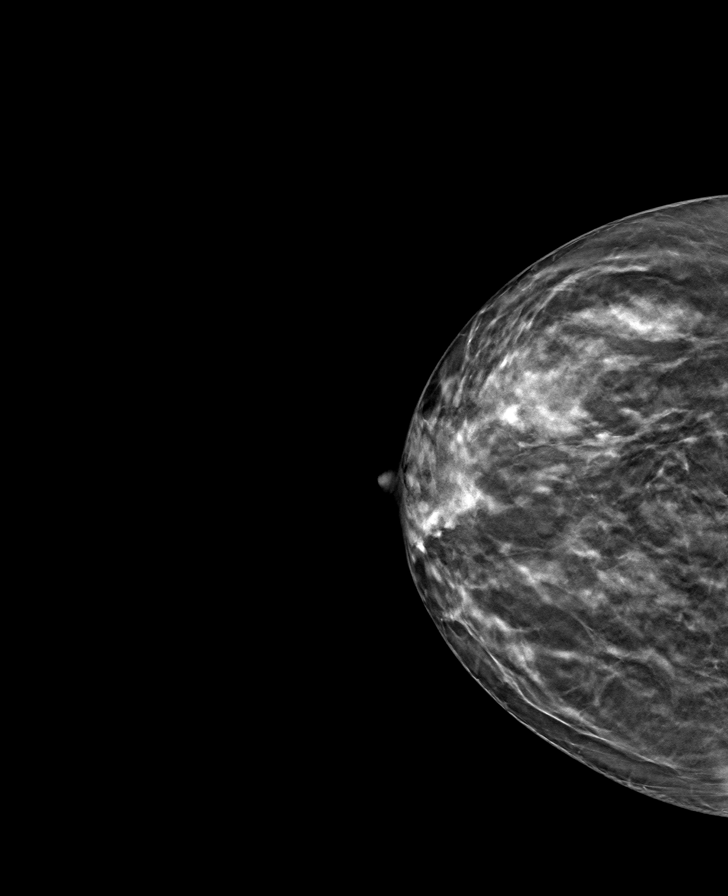

[L MLO tomo · tomo slice 31/61.0]
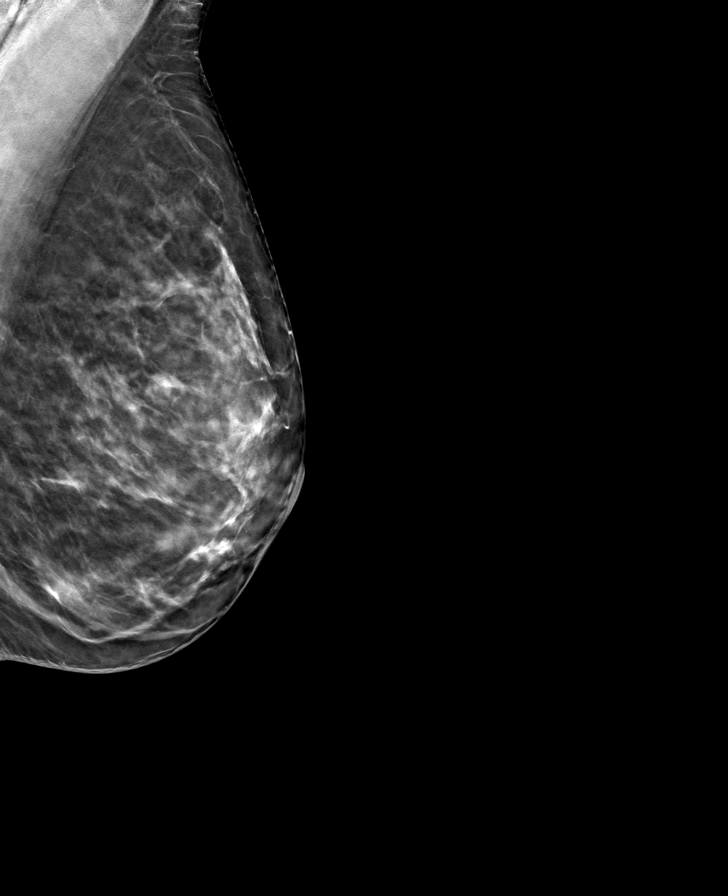

[R MLO tomo · tomo slice 29/56.0]
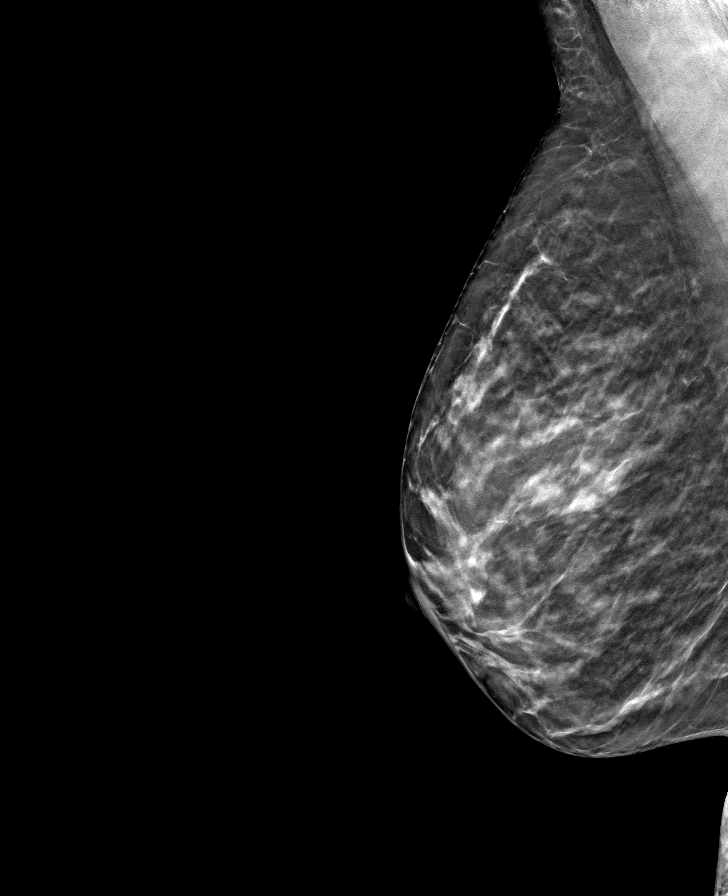

[8 of 24 positions shown; findings below may reference images not displayed]

ACR Breast Density Category c: The breast tissue is heterogeneously
dense, which may obscure small masses.
FINDINGS: There are no findings suspicious for malignancy. Images were
processed with CAD.
IMPRESSION: No mammographic evidence of malignancy. A result letter of this
screening mammogram will be mailed directly to the patient.

RECOMMENDATION:
Screening mammogram in one year. (Code:FT-[id number])

BI-RADS CATEGORY  1: Negative.

## 2023-01-25 ENCOUNTER — Other Ambulatory Visit (HOSPITAL_BASED_OUTPATIENT_CLINIC_OR_DEPARTMENT_OTHER): Payer: Self-pay

## 2023-03-06 ENCOUNTER — Other Ambulatory Visit (HOSPITAL_COMMUNITY): Payer: Self-pay

## 2023-03-06 MED ORDER — ESCITALOPRAM OXALATE 10 MG PO TABS
10.0000 mg | ORAL_TABLET | Freq: Every day | ORAL | 0 refills | Status: DC
  Filled 2023-03-06: qty 90, 90d supply, fill #0

## 2023-09-03 ENCOUNTER — Other Ambulatory Visit (HOSPITAL_BASED_OUTPATIENT_CLINIC_OR_DEPARTMENT_OTHER): Payer: Self-pay

## 2024-04-09 ENCOUNTER — Other Ambulatory Visit: Payer: Self-pay | Admitting: Family Medicine

## 2024-04-09 DIAGNOSIS — Z1231 Encounter for screening mammogram for malignant neoplasm of breast: Secondary | ICD-10-CM

## 2024-04-16 ENCOUNTER — Ambulatory Visit
Admission: RE | Admit: 2024-04-16 | Discharge: 2024-04-16 | Disposition: A | Discharge status: Home / Self Care | Source: Ambulatory Visit | Attending: Family Medicine | Admitting: Family Medicine

## 2024-04-16 DIAGNOSIS — Z1231 Encounter for screening mammogram for malignant neoplasm of breast: Secondary | ICD-10-CM

## 2024-06-23 ENCOUNTER — Encounter: Payer: Self-pay | Admitting: Nurse Practitioner

## 2024-06-23 ENCOUNTER — Ambulatory Visit: Admitting: Nurse Practitioner

## 2024-06-23 NOTE — Progress Notes (Unsigned)
 Subjective:  New Patient (Initial Visit)    HPI: Amber Mendez is a 58 y.o. female presenting on 06/23/2024 presents for psychiatric evaluation and medication management.  Divorced for about 58 yaers Patient daughter just graduated from [employer]. 24 works at [employer]  for about a year.  Lexapro  10 mg po daily. Patient work for [employer].  Depression  Anxiety whe she can't solve things very thing has to be perfect 0    ROS: Negative unless specifically indicated above in HPI.   Relevant past medical history reviewed and updated as indicated.   Allergies and medications reviewed and updated.   Current Outpatient Medications  Medication Instructions   ALPRAZolam  (XANAX ) 1 MG tablet Take 1/2 tablet by mouth every morning and 1 tablet at bedtime  as needed for severe anxiety or panic attacks.   ALPRAZolam  (XANAX ) 1 MG tablet Take 1/2 tablet by mouth every morning and 1 tablet at bedtime  as needed for severe anxiety/panic attacks   ALPRAZolam  (XANAX ) 1 MG tablet Take 0.5 tablets (0.5 mg total) by mouth every morning and 1 tablet (1 mg total) at bedtime as needed for severe anxiety/ panic attacks   ALPRAZolam  (XANAX ) 1 MG tablet Take 1/2 tablet by mouth every morning and 1 tablet at bedtime  as needed for severe anxiety/panic attacks   ALPRAZolam  (XANAX ) 1 mg, 2 times daily PRN   diclofenac  (VOLTAREN ) 75 MG EC tablet TAKE 1 TABLET(75 MG) BY MOUTH TWICE DAILY   DULoxetine  (CYMBALTA ) 30 MG capsule Take 1 capsule (30 mg total) by mouth at bedtime for depression/anxiety   DULoxetine  (CYMBALTA ) 30 MG capsule Take 1 capsule by mouth at bedtime for depression/anxiety   DULoxetine  (CYMBALTA ) 60 MG capsule Take 1 capsule (60 mg total) by mouth at bedtime for depression/anxiety   escitalopram  (LEXAPRO ) 10 MG tablet Take 1 tablet (10 mg total) by mouth at bedtime for anxiety/depression   fluconazole  (DIFLUCAN ) 150 MG tablet Take 1 tablet (150 mg total) by mouth on day 1,  then take 1 tablet on day 4 if still having symptoms   gabapentin  (NEURONTIN ) 100 MG capsule Take 1 capsule (100 mg total) by mouth 3 (three) times daily for anxiety.   gabapentin  (NEURONTIN ) 100 MG capsule Take 1 capsule (100 mg total) by mouth 3 (three) times daily for anxiety   gabapentin  (NEURONTIN ) 100 MG capsule Take 1 capsule(s) by mouth three times a day for anxiety   gabapentin  (NEURONTIN ) 100 MG capsule Take 1 capsule by mouth three times a day for anxiety   hydrOXYzine  (ATARAX ) 10 MG tablet Take 1 tablet (10 mg total) by mouth 3 (three) times daily as needed for anxiety.   lamoTRIgine  (LAMICTAL ) 25 MG tablet Take 1 tablet (25 mg total) by mouth at bedtime for mood   triamterene -hydrochlorothiazide  (DYAZIDE ) 37.5-25 MG capsule TAKE 1 CAPSULE BY MOUTH ONCE DAILY IN THE MORNING   triamterene -hydrochlorothiazide  (DYAZIDE ) 37.5-25 MG per capsule 1 capsule, Daily   Vitamin D, Ergocalciferol, (DRISDOL) 50000 units CAPS capsule TK 1 C PO 1 TIME A WK   zolpidem (AMBIEN) 5 MG tablet TK 1 T PO ONCE D HS     Allergies[1]  Objective:   BP 114/82 (BP Location: Left Arm, Cuff Size: Normal)   Pulse 76   Resp 18   Ht 5' 5 (1.651 m)   Wt 166 lb (75.3 kg)   SpO2 98%   BMI 27.62 kg/m    Physical Exam Constitutional:      Appearance: Normal appearance.  HENT:     Head: Normocephalic.  Eyes:     Conjunctiva/sclera: Conjunctivae normal.  Cardiovascular:     Rate and Rhythm: Normal rate and regular rhythm.  Pulmonary:     Effort: Pulmonary effort is normal.     Breath sounds: Normal breath sounds.  Skin:    General: Skin is warm and dry.  Neurological:     General: No focal deficit present.     Mental Status: She is alert and oriented to person, place, and time.  Psychiatric:        Mood and Affect: Mood normal.        Behavior: Behavior normal.        Thought Content: Thought content normal.        Judgment: Judgment normal.        Assessment & Plan:   Assessment  & Plan     Follow up plan: No follow-ups on file.  Florencia Cousin, NP       [1] No Known Allergies

## 2024-07-22 ENCOUNTER — Ambulatory Visit: Admitting: Nurse Practitioner

## 2024-07-28 ENCOUNTER — Ambulatory Visit: Admitting: Nurse Practitioner

## 2024-07-29 ENCOUNTER — Telehealth: Payer: Self-pay | Admitting: Nurse Practitioner

## 2024-07-29 VITALS — Ht 65.0 in | Wt 167.0 lb

## 2024-07-29 DIAGNOSIS — F331 Major depressive disorder, recurrent, moderate: Secondary | ICD-10-CM | POA: Insufficient documentation

## 2024-07-29 DIAGNOSIS — F5105 Insomnia due to other mental disorder: Secondary | ICD-10-CM | POA: Insufficient documentation

## 2024-07-29 DIAGNOSIS — F419 Anxiety disorder, unspecified: Secondary | ICD-10-CM | POA: Insufficient documentation

## 2024-07-29 NOTE — Progress Notes (Signed)
 "  Subjective:  I'm doing pretty good.    HPI: Amber Mendez is a 59 y.o. female presenting on 07/29/2024 via telehealth for medication follow up.   Patient reports she liked the Wellbutrin Xl but she is not going to be able to take it right now as she is going through menopause and it gives her hot flashes. However, she is doing fine on the Lexapro  10 mg and willing to increase the dose to 20 mg to help with her anxiety and depression as she is not able to take the Wellbutrin Xl at this time. She reports she is sleeping well on the Trazodone 50 mg. She reports improvement with decreased anxiety, depressive symptoms, apprehensions, worries, and racing thoughts.   She reports she is utilizing her coping mechanism that we worked on in therapy to help with managing her stress and anxiety when working. 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 She is also proud to tell me she has taken up knitting and this has really help with managing her anxiety. She also reports she is taking Vitamin D3 + K2 daiy and Magnesium Glycinate at bedtime and this has realky helped with her depression, anxiety and sleep.   She denies poor appetite, sleep issues, adverse reaction to her medications, psychosis, delusions, suicidal or homicidal ideations.    ROS: Negative unless specifically indicated above in HPI.   Relevant past medical history reviewed and updated as indicated.   Allergies and medications reviewed and updated.   Current Outpatient Medications  Medication Instructions   ALPRAZolam  (XANAX ) 1 MG tablet Take 1/2 tablet by mouth every morning and 1 tablet at bedtime  as needed for severe anxiety or panic attacks.   ALPRAZolam  (XANAX ) 1 MG tablet Take 1/2 tablet by mouth every morning and 1 tablet at bedtime  as needed for severe anxiety/panic attacks   ALPRAZolam  (XANAX ) 1 MG tablet Take 0.5 tablets (0.5 mg total) by mouth every morning and 1 tablet (1 mg total) at  bedtime as needed for severe anxiety/ panic attacks   ALPRAZolam  (XANAX ) 1 MG tablet Take 1/2 tablet by mouth every morning and 1 tablet at bedtime  as needed for severe anxiety/panic attacks   ALPRAZolam  (XANAX ) 1 mg, 2 times daily PRN   buPROPion (WELLBUTRIN XL) 150 mg, Every morning   diclofenac  (VOLTAREN ) 75 MG EC tablet TAKE 1 TABLET(75 MG) BY MOUTH TWICE DAILY   DULoxetine  (CYMBALTA ) 30 MG capsule Take 1 capsule (30 mg total) by mouth at bedtime for depression/anxiety   DULoxetine  (CYMBALTA ) 30 MG capsule Take 1 capsule by mouth at bedtime for depression/anxiety   DULoxetine  (CYMBALTA ) 60 MG capsule Take 1 capsule (60 mg total) by mouth at bedtime for depression/anxiety   escitalopram  (LEXAPRO ) 10 MG tablet Take 1 tablet (10 mg total) by mouth at bedtime for anxiety/depression   escitalopram  (LEXAPRO ) 10 mg, Daily   fluconazole  (DIFLUCAN ) 150 MG tablet Take 1 tablet (150 mg total) by mouth on day 1, then take 1 tablet on day 4 if still having symptoms   gabapentin  (NEURONTIN ) 100 MG capsule Take 1 capsule (100 mg total) by mouth 3 (three) times daily for anxiety.   gabapentin  (NEURONTIN ) 100 MG capsule Take 1 capsule (100 mg total) by mouth 3 (three) times daily for anxiety   gabapentin  (NEURONTIN ) 100 MG capsule Take 1 capsule(s) by mouth three times a day for anxiety   gabapentin  (NEURONTIN ) 100 MG capsule Take 1 capsule by mouth three times  a day for anxiety   hydrOXYzine  (ATARAX ) 10 MG tablet Take 1 tablet (10 mg total) by mouth 3 (three) times daily as needed for anxiety.   lamoTRIgine  (LAMICTAL ) 25 MG tablet Take 1 tablet (25 mg total) by mouth at bedtime for mood   traZODone (DESYREL) 50 mg, Daily at bedtime   triamterene -hydrochlorothiazide  (DYAZIDE ) 37.5-25 MG capsule TAKE 1 CAPSULE BY MOUTH ONCE DAILY IN THE MORNING   triamterene -hydrochlorothiazide  (DYAZIDE ) 37.5-25 MG per capsule 1 capsule, Daily   Vitamin D, Ergocalciferol, (DRISDOL) 50000 units CAPS capsule TK 1 C PO 1 TIME A  WK   Vitamin D-Vitamin K (VITAMIN K2-VITAMIN D3) 90-125 MCG CAPS 1 tablet, Weekly   zolpidem (AMBIEN) 5 MG tablet TK 1 T PO ONCE D HS     Allergies[1]  Objective:   Ht 5' 5 (1.651 m)   Wt 167 lb (75.8 kg)   BMI 27.79 kg/m    Physical Exam Constitutional:      Appearance: Normal appearance.  HENT:     Head: Normocephalic.  Eyes:     Conjunctiva/sclera: Conjunctivae normal.  Neurological:     General: No focal deficit present.     Mental Status: She is alert and oriented to person, place, and time.  Psychiatric:        Mood and Affect: Mood normal.        Behavior: Behavior normal.        Thought Content: Thought content normal.        Judgment: Judgment normal.    Assessment & Plan:   Assessment & Plan Moderate episode of recurrent major depressive disorder (HCC)     Anxiety     Insomnia disorder, with non-sleep disorder mental comorbidity, recurrent     Discontinue Wellbutrin Xl 150 mg po qam for depression Increase Lexapro  10 mg to 20 mg po daily for depression/anxiety  Continue Trazodone 50 mg po at bedtime for anxiety/sleep   Follow up plan: Return in about 4 weeks (around 08/26/2024) for Medication Follow-up.  Florencia Cousin, NP      [1] No Known Allergies  "

## 2024-07-29 NOTE — Assessment & Plan Note (Signed)
 Amber Mendez

## 2024-07-29 NOTE — Assessment & Plan Note (Addendum)
 SABRA

## 2024-08-26 ENCOUNTER — Telehealth: Payer: Self-pay | Admitting: Nurse Practitioner
